# Patient Record
Sex: Female | Born: 1975 | Race: Black or African American | Hispanic: No | Marital: Single | Smoking: Never smoker
Health system: Southern US, Community
[De-identification: ages and names within clinical notes are randomized; demographics above are authoritative.]

## PROBLEM LIST (undated history)

## (undated) DIAGNOSIS — I10 Essential (primary) hypertension: Secondary | ICD-10-CM

## (undated) HISTORY — PX: CHOLECYSTECTOMY: SHX55

## (undated) HISTORY — PX: BREAST SURGERY: SHX581

---

## 2006-09-17 ENCOUNTER — Inpatient Hospital Stay (HOSPITAL_COMMUNITY): Admission: AD | Admit: 2006-09-17 | Discharge: 2006-09-17 | Payer: Self-pay | Admitting: Obstetrics and Gynecology

## 2017-04-12 ENCOUNTER — Observation Stay (HOSPITAL_BASED_OUTPATIENT_CLINIC_OR_DEPARTMENT_OTHER)
Admission: EM | Admit: 2017-04-12 | Discharge: 2017-04-13 | Disposition: A | Discharge status: Home / Self Care | Payer: BLUE CROSS/BLUE SHIELD | Attending: Internal Medicine | Admitting: Internal Medicine

## 2017-04-12 ENCOUNTER — Encounter (HOSPITAL_BASED_OUTPATIENT_CLINIC_OR_DEPARTMENT_OTHER): Payer: Self-pay | Admitting: Emergency Medicine

## 2017-04-12 ENCOUNTER — Observation Stay (HOSPITAL_COMMUNITY): Payer: BLUE CROSS/BLUE SHIELD

## 2017-04-12 ENCOUNTER — Emergency Department (HOSPITAL_BASED_OUTPATIENT_CLINIC_OR_DEPARTMENT_OTHER): Payer: BLUE CROSS/BLUE SHIELD

## 2017-04-12 DIAGNOSIS — R2990 Unspecified symptoms and signs involving the nervous system: Secondary | ICD-10-CM | POA: Diagnosis present

## 2017-04-12 DIAGNOSIS — R200 Anesthesia of skin: Secondary | ICD-10-CM | POA: Diagnosis not present

## 2017-04-12 DIAGNOSIS — I10 Essential (primary) hypertension: Secondary | ICD-10-CM | POA: Insufficient documentation

## 2017-04-12 DIAGNOSIS — R531 Weakness: Secondary | ICD-10-CM | POA: Insufficient documentation

## 2017-04-12 DIAGNOSIS — I639 Cerebral infarction, unspecified: Secondary | ICD-10-CM

## 2017-04-12 HISTORY — DX: Essential (primary) hypertension: I10

## 2017-04-12 LAB — CBC
HEMATOCRIT: 38.3 % (ref 36.0–46.0)
HEMATOCRIT: 36.6 % (ref 36.0–46.0)
Hemoglobin: 13.2 g/dL (ref 12.0–15.0)
Hemoglobin: 12.1 g/dL (ref 12.0–15.0)
MCH: 33.2 pg (ref 26.0–34.0)
MCH: 31.5 pg (ref 26.0–34.0)
MCHC: 34.5 g/dL (ref 30.0–36.0)
MCHC: 33.1 g/dL (ref 30.0–36.0)
MCV: 96.2 fL (ref 78.0–100.0)
MCV: 95.3 fL (ref 78.0–100.0)
PLATELETS: 370 10*3/uL (ref 150–400)
Platelets: 358 10*3/uL (ref 150–400)
RBC: 3.98 MIL/uL (ref 3.87–5.11)
RBC: 3.84 MIL/uL — AB (ref 3.87–5.11)
RDW: 11.9 % (ref 11.5–15.5)
RDW: 12.2 % (ref 11.5–15.5)
WBC: 8.2 10*3/uL (ref 4.0–10.5)
WBC: 7.3 10*3/uL (ref 4.0–10.5)

## 2017-04-12 LAB — URINALYSIS, ROUTINE W REFLEX MICROSCOPIC
BILIRUBIN URINE: NEGATIVE
GLUCOSE, UA: NEGATIVE mg/dL
KETONES UR: NEGATIVE mg/dL
Leukocytes, UA: NEGATIVE
Nitrite: NEGATIVE
PROTEIN: NEGATIVE mg/dL
Specific Gravity, Urine: 1.012 (ref 1.005–1.030)
pH: 5.5 (ref 5.0–8.0)

## 2017-04-12 LAB — COMPREHENSIVE METABOLIC PANEL
ALT: 14 U/L (ref 14–54)
AST: 19 U/L (ref 15–41)
Albumin: 4.2 g/dL (ref 3.5–5.0)
Alkaline Phosphatase: 41 U/L (ref 38–126)
Anion gap: 9 (ref 5–15)
BUN: 11 mg/dL (ref 6–20)
CHLORIDE: 106 mmol/L (ref 101–111)
CO2: 23 mmol/L (ref 22–32)
Calcium: 9.3 mg/dL (ref 8.9–10.3)
Creatinine, Ser: 0.72 mg/dL (ref 0.44–1.00)
Glucose, Bld: 93 mg/dL (ref 65–99)
POTASSIUM: 3 mmol/L — AB (ref 3.5–5.1)
SODIUM: 138 mmol/L (ref 135–145)
Total Bilirubin: 1.2 mg/dL (ref 0.3–1.2)
Total Protein: 8.4 g/dL — ABNORMAL HIGH (ref 6.5–8.1)

## 2017-04-12 LAB — URINALYSIS, MICROSCOPIC (REFLEX): WBC, UA: NONE SEEN WBC/hpf (ref 0–5)

## 2017-04-12 LAB — CREATININE, SERUM
Creatinine, Ser: 0.82 mg/dL (ref 0.44–1.00)
GFR calc non Af Amer: >60 mL/min (ref >60)

## 2017-04-12 LAB — PREGNANCY, URINE: PREG TEST UR: NEGATIVE

## 2017-04-12 LAB — DIFFERENTIAL
BASOS ABS: 0 10*3/uL (ref 0.0–0.1)
BASOS PCT: 0 %
EOS ABS: 0.1 10*3/uL (ref 0.0–0.7)
Eosinophils Relative: 2 %
Lymphocytes Relative: 44 %
Lymphs Abs: 3.6 10*3/uL (ref 0.7–4.0)
MONO ABS: 0.7 10*3/uL (ref 0.1–1.0)
MONOS PCT: 9 %
NEUTROS ABS: 3.7 10*3/uL (ref 1.7–7.7)
Neutrophils Relative %: 45 %

## 2017-04-12 LAB — PROTIME-INR
INR: 1.01
PROTHROMBIN TIME: 13.3 s (ref 11.4–15.2)

## 2017-04-12 LAB — APTT: APTT: 23 s — AB (ref 24–36)

## 2017-04-12 LAB — CBG MONITORING, ED: GLUCOSE-CAPILLARY: 94 mg/dL (ref 65–99)

## 2017-04-12 LAB — TROPONIN I

## 2017-04-12 MED ORDER — ACETAMINOPHEN 650 MG RE SUPP: 650.0000 mg | RECTAL | Status: DC | PRN

## 2017-04-12 MED ORDER — HEPARIN SODIUM (PORCINE) 5000 UNIT/ML IJ SOLN
5000.0000 [IU] | Freq: Three times a day (TID) | INTRAMUSCULAR | Status: DC
  Administered 2017-04-12 – 2017-04-13 (×2): 5000 [IU] via SUBCUTANEOUS
  Filled 2017-04-12: qty 1

## 2017-04-12 MED ORDER — SODIUM CHLORIDE 0.9 % IV SOLN
INTRAVENOUS | Status: AC
  Administered 2017-04-12: 15:00:00 via INTRAVENOUS

## 2017-04-12 MED ORDER — ASPIRIN 81 MG PO CHEW
324.0000 mg | CHEWABLE_TABLET | Freq: Once | ORAL | Status: AC
  Administered 2017-04-12: 324 mg via ORAL
  Filled 2017-04-12: qty 4

## 2017-04-12 MED ORDER — HYDRALAZINE HCL 20 MG/ML IJ SOLN: 10.0000 mg | Freq: Three times a day (TID) | INTRAMUSCULAR | Status: DC | PRN

## 2017-04-12 MED ORDER — ACETAMINOPHEN 160 MG/5ML PO SOLN: 650.0000 mg | ORAL | Status: DC | PRN

## 2017-04-12 MED ORDER — POTASSIUM CHLORIDE CRYS ER 20 MEQ PO TBCR
40.0000 meq | EXTENDED_RELEASE_TABLET | Freq: Once | ORAL | Status: AC
  Administered 2017-04-12: 40 meq via ORAL
  Filled 2017-04-12 (×2): qty 2

## 2017-04-12 MED ORDER — ACETAMINOPHEN 325 MG PO TABS: 650.0000 mg | ORAL_TABLET | ORAL | Status: DC | PRN

## 2017-04-12 MED ORDER — STROKE: EARLY STAGES OF RECOVERY BOOK
Freq: Once | Status: AC
  Administered 2017-04-12: 1

## 2017-04-12 NOTE — ED Notes (Signed)
ED Provider at bedside. 

## 2017-04-12 NOTE — ED Notes (Signed)
At bedside from 1056 - 1120   - patient continues to be A/O x 3 - Awaiting patient transfer

## 2017-04-12 NOTE — ED Notes (Signed)
Patient transported to CT 

## 2017-04-12 NOTE — ED Triage Notes (Signed)
Numbness to L side of face and arm at 8:45 this morning. Noted to have L side weakness to arm and L side arm drift noted.

## 2017-04-12 NOTE — Progress Notes (Signed)
Called by Dr. Lynelle DoctorKnapp about possible stroke in this patient. Pt woke with L sided numbness in the early morning hours this morning but went back to sleep. At about 0845 she noted return of L sided numbness and c/o L weakness as well. On exam she reportedly has mild L sided weakness with drift, no other objective findings. NIHSS 2. Given description of minor deficits would not recommend tPA. Advise admit to [facility] for routine stroke evaluation. Will see in consultation as needed.

## 2017-04-12 NOTE — ED Notes (Signed)
Pt transported to CT; RN at bedside.  

## 2017-04-12 NOTE — Plan of Care (Signed)
CC: L numbness & weakness Dx: stroke like sx HPI: Pt awoke with LUE numbness, went back to sleep thinking she slept on it. Felt it resolved but then symptoms recurred with left upper and lower extremity numbness and facial numbness and left-sided weakness as well. Patient then went to the emergency room for evaluation. ED course: Case discussed with Dr. Roxy Mannsster (neuro) advised no TPA. NIH score 2. CT head neg.  Plan: stroke w/u  Ordered: asa, UA, u preg Status: obs, tele  Bianca SalterPhillip M Hobbs MD

## 2017-04-12 NOTE — Evaluation (Signed)
Physical Therapy Evaluation Patient Details Name: Bianca Moutoniffany Villa MRN: [id number] DOB: 05-15-1976 Today's Date: 04/12/2017   History of Present Illness  Patient is a 41 yo female admitted 04/12/17 with numbness of Lt face and LUE.  NIH = 2.   MRI negative.     PMH:  HTN  Clinical Impression  Patient with problems listed below.  Patient's LLE tested slightly weaker than RLE - ? Effort.  With gait, patient with Lt knee buckling impacting gait.  Recommend OP PT to address LLE and gait.    Follow Up Recommendations Outpatient PT;Supervision - Intermittent    Equipment Recommendations  None recommended by PT    Recommendations for Other Services       Precautions / Restrictions Precautions Precautions: None Restrictions Weight Bearing Restrictions: No      Mobility  Bed Mobility Overal bed mobility: Independent                Transfers Overall transfer level: Modified independent Equipment used: None             General transfer comment: Increased time  Ambulation/Gait Ambulation/Gait assistance: Min guard Ambulation Distance (Feet): 140 Feet Assistive device: None Gait Pattern/deviations: Step-through pattern;Decreased stance time - left;Decreased step length - right;Decreased stride length Gait velocity: decreased Gait velocity interpretation: Below normal speed for age/gender General Gait Details: Patient with Lt knee slightly buckling during gait.  Patient able to catch herself with no loss of balance.   During distraction of conversation, no buckling noted.  Stairs Stairs: Yes Stairs assistance: Min guard Stair Management: One rail Right;Step to pattern;Forwards Number of Stairs: 3 General stair comments: Instructed patient on step-to negotiation of stairs.  Patient able to perform with rail on Rt.  Wheelchair Mobility    Modified Rankin (Stroke Patients Only) Modified Rankin (Stroke Patients Only) Pre-Morbid Rankin Score: No symptoms Modified  Rankin: Slight disability     Balance Overall balance assessment: No apparent balance deficits (not formally assessed)                                           Pertinent Vitals/Pain Pain Assessment: No/denies pain    Home Living Family/patient expects to be discharged to:: Private residence Living Arrangements: Spouse/significant other;Children Available Help at Discharge: Family;Available PRN/intermittently Type of Home: Apartment Home Access: Stairs to enter Entrance Stairs-Rails: Right Entrance Stairs-Number of Steps: 3 flights Home Layout: One level Home Equipment: None      Prior Function Level of Independence: Independent               Hand Dominance   Dominant Hand: Right    Extremity/Trunk Assessment   Upper Extremity Assessment Upper Extremity Assessment: Defer to OT evaluation    Lower Extremity Assessment Lower Extremity Assessment: LLE deficits/detail LLE Deficits / Details: LLE MMT tested slightly weaker than RLE - grossly 4/5.   Question effort.    Cervical / Trunk Assessment Cervical / Trunk Assessment: Normal  Communication   Communication: No difficulties  Cognition Arousal/Alertness: Awake/alert Behavior During Therapy: Flat affect Overall Cognitive Status: Within Functional Limits for tasks assessed                                        General Comments      Exercises General Exercises - Lower  Extremity Mini-Sqauts: AROM;Both;15 reps;Standing (Feet even, Rt foot forward, Lt foot forward)   Assessment/Plan    PT Assessment Patient needs continued PT services  PT Problem List Impaired sensation;Decreased mobility       PT Treatment Interventions Gait training;Stair training;Functional mobility training;Therapeutic activities;Therapeutic exercise;Patient/family education    PT Goals (Current goals can be found in the Care Plan section)  Acute Rehab PT Goals Patient Stated Goal: None  stated PT Goal Formulation: With patient Time For Goal Achievement: 04/19/17 Potential to Achieve Goals: Good    Frequency Min 3X/week   Barriers to discharge        Co-evaluation               AM-PAC PT "6 Clicks" Daily Activity  Outcome Measure Difficulty turning over in bed (including adjusting bedclothes, sheets and blankets)?: None Difficulty moving from lying on back to sitting on the side of the bed? : None Difficulty sitting down on and standing up from a chair with arms (e.g., wheelchair, bedside commode, etc,.)?: None Help needed moving to and from a bed to chair (including a wheelchair)?: A Little Help needed walking in hospital room?: A Little Help needed climbing 3-5 steps with a railing? : A Little 6 Click Score: 21    End of Session Equipment Utilized During Treatment: Gait belt Activity Tolerance: Patient tolerated treatment well Patient left: in bed;with call bell/phone within reach;with family/visitor present Nurse Communication: Mobility status PT Visit Diagnosis: Other abnormalities of gait and mobility (R26.89)    Time: 8119-1478 PT Time Calculation (min) (ACUTE ONLY): 27 min   Charges:   PT Evaluation $PT Eval Moderate Complexity: 1 Procedure PT Treatments $Gait Training: 8-22 mins   PT G Codes:   PT G-Codes **NOT FOR INPATIENT CLASS** Functional Assessment Tool Used: AM-PAC 6 Clicks Basic Mobility Functional Limitation: Mobility: Walking and moving around Mobility: Walking and Moving Around Current Status (G9562): At least 20 percent but less than 40 percent impaired, limited or restricted Mobility: Walking and Moving Around Goal Status [phone]): At least 1 percent but less than 20 percent impaired, limited or restricted    Durenda Hurt. Renaldo Fiddler, [facility] Acute Rehab Services Pager [phone]   Vena Austria 04/12/2017, 11:39 PM

## 2017-04-12 NOTE — Consult Note (Signed)
Neurology Consult Note  Reason for Consultation: L-sided numbness  Requesting provider: Eliezer BottomPhillip Hobbs, MD  CC: left-sided numbness  HPI: This is a 40-yo RH woman who is transferred from [facility] for admission for possible stroke. History is obtained directly from the patient who is an excellent historian.   She reports that she was in her usual state of health when she went to be last night. At about 2 am, she woke up to check on the house when she noted some mild numbness of her left face and arm. She figured she may have slept on her left side, causing her numbness, and went back to sleep. When she got up this morning her numbness was gone. However, at about 0830, she was driving her car when the numbness returned. This involves her left face and arm with lesser involvement of her left leg. She denies any weakness, vision loss, double vision, speech changes, difficulty swallowing, problems with coordination, or changes in her gait. She states that she always has a headache but did not notice any unusual headache today. She has never had any similar symptoms in the past.   She went to [facility] where she was noted to have NIHSS score of 2 with subjective numbness and drift of the left arm and left leg. CTH was obtained and was negative. I was called by the ED MD and based upon her mild deficits I decided not to administer tPA. She has been transferred to [facility] for [facility] and further evaluation.   She denies any recent illnesses. 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   PMH:  Past Medical History:  Diagnosis Date  . Hypertension     PSH:  Past Surgical History:  Procedure Laterality Date  . BREAST SURGERY    . CHOLECYSTECTOMY      Family history: Sister with DM. Grandmother had a stroke late in life.   Social history:  Social History   Social History  . Marital status: Single    Spouse name: N/A   . Number of children: N/A  . Years of education: N/A   Occupational History  . Not on file.   Social History Main Topics  . Smoking status: Never Smoker  . Smokeless tobacco: Never Used  . Alcohol use Not on file  . Drug use: Unknown  . Sexual activity: Not on file   Other Topics Concern  . Not on file   Social History Narrative  . No narrative on file    Current outpatient meds: Medications reviewed and reconciled.  Current Meds  Medication Sig  . amLODipine (NORVASC) 10 MG tablet Take 10 mg by mouth daily.    Current inpatient meds: Medications reviewed and reconciled.  Current Facility-Administered Medications  Medication Dose Route Frequency Provider Last Rate Last Dose  . potassium chloride SA (K-DUR,KLOR-CON) CR tablet 40 mEq  40 mEq Oral Once Linwood DibblesKnapp, Jon, MD        Allergies: Not on File  ROS: As per HPI. A full 14-point review of systems was performed and is otherwise unremarkable.   PE:  BP 126/82   Pulse 87   Temp 98.5 F (36.9 C)   Resp 18   Ht 5\' 2"  (1.575 m)   Wt 78.5 kg (173 lb)   SpO2 100%   BMI 31.64 kg/m   General: WDWN, no acute distress. AAO x4. Speech clear, no dysarthria. No  aphasia. Follows commands briskly. Affect is mildly restricted. Comportment is normal.  HEENT: Normocephalic. Neck supple without LAD. MMM, OP clear. Dentition good. Sclerae anicteric. No conjunctival injection.  CV: Regular, no murmur. Carotid pulses full and symmetric, no bruits. Distal pulses 2+ and symmetric.  Lungs: CTAB.  Abdomen: Soft, non-distended, non-tender. Bowel sounds present x4.  Extremities: No C/C/E. Neuro:  CN: Pupils are equal and round. They are symmetrically reactive from 3-->2 mm. EOMI without nystagmus. No reported diplopia. Facial sensation is decreased to light touch and pinprick. Vibration is diminished on the L forehead, intact on the R. She appear to split the midline. Face is symmetric at rest with normal strength and mobility. Hearing is  intact to conversational voice. Palate elevates symmetrically and uvula is midline. Voice is normal in tone, pitch and quality. Bilateral SCM and trapezii are 5/5. Tongue is midline with normal bulk and mobility.  Motor: Normal bulk, tone, and strength. She has some inconsistent weakness on the left side when that side is tested independently vs when both sides are tested simultaneously. No tremor or other abnormal movements. No drift.  Sensation: Decreased to light touch and pinprick on the L side.  DTRs: 2+, symmetric. Toes downgoing bilaterally. No pathologic reflexes.  Coordination: Finger-to-nose and heel-to-shin are without dysmetria. Finger taps are normal in amplitude and speed, no decrement.    Labs:  Lab Results  Component Value Date   WBC 8.2 04/12/2017   HGB 13.2 04/12/2017   HCT 38.3 04/12/2017   PLT 370 04/12/2017   GLUCOSE 93 04/12/2017   ALT 14 04/12/2017   AST 19 04/12/2017   NA 138 04/12/2017   K 3.0 (L) 04/12/2017   CL 106 04/12/2017   CREATININE 0.72 04/12/2017   BUN 11 04/12/2017   CO2 23 04/12/2017   INR 1.01 04/12/2017   Troponin <0.03 PTT 23 Upreg negative UA notable for large Hgb  Imaging:  I have personally and independently reviewed the [facility] without contrast from today. This is unremarkable.  Assessment and Plan:  1. Left-sided numbness: This is acute. he reports decreased sensation over the left face, arm and leg with midline splitting. Vibration is reduced on the left side of the forehead but intact on the right. Findings are atypical and raise concern for non-physiologic sensory loss. Recommend MRI brain to further investigate. If this is normal, there is no need to complete the remainder of the stroke workup. PT/OT to evaluate and treat.   2. Left-sided weakness: This is acute. This is inconsistent and suggestive of non-physiologic weakness. MRI brain, PT, OT as above.   This was discussed with the patient. Education was provided on the diagnosis  and expected evaluation and treatment. She is in agreement with the plan as noted. She was given the opportunity to ask any questions and these were addressed to her satisfaction.

## 2017-04-12 NOTE — ED Notes (Signed)
Per MD the patient is stable for transfer. MD knapp at the bedside with another critical patient

## 2017-04-12 NOTE — H&P (Signed)
[facility] History and Physical  Bianca Moutoniffany Wohler YNW:[id number] DOB: 1976-04-21 DOA: 04/12/2017  Referring physician:  PCP: Shellia CleverlyBeane, Lori M, PA   Chief Complaint: Left extremity weakness  HPI: Bianca Villa is a 41 y.o. female past history significant for hypertension. Pt awoke with LUE numbness, went back to sleep thinking she slept on it. Felt it resolved but then symptoms recurred with left upper and lower extremity numbness and facial numbness and left-sided weakness as well. Patient then went to the emergency room for evaluation.  No family history of stroke. Denies tobacco history.  ED course: Case discussed with Dr. Roxy Mannsster (neuro) advised no TPA. NIH score 2. CT head neg.   Review of Systems:  As per HPI otherwise 10 point review of systems negative.    Past Medical History:  Diagnosis Date  . Hypertension    Past Surgical History:  Procedure Laterality Date  . BREAST SURGERY    . CHOLECYSTECTOMY     Social History:  reports that she has never smoked. She has never used smokeless tobacco. Her alcohol and drug histories are not on file.  Allergies  Allergen Reactions  . Atenolol Other (See Comments)    unknown  . Potassium-Containing Compounds Rash  . Sulfa Antibiotics Rash    No family history on file.   Prior to Admission medications   Medication Sig Start Date End Date Taking? Authorizing Provider  amLODipine (NORVASC) 10 MG tablet Take 10 mg by mouth daily.   Yes [provider]   Physical Exam: Vitals:   04/12/17 1303 04/12/17 1330 04/12/17 1500 04/12/17 1800  BP:  126/82 133/78 120/76  Pulse:  87 74 64  Resp:  18 20 18   Temp: 98.5 F (36.9 C)  98.6 F (37 C) 98.6 F (37 C)  TempSrc:   Oral   SpO2:  100% 100% 99%  Weight:      Height:        Wt Readings from Last 3 Encounters:  04/12/17 78.5 kg (173 lb)    General:  Appears calm and comfortable; A&Ox3 Eyes:  PERRL, EOMI, normal lids, iris ENT:  grossly normal hearing,  lips & tongue Neck:  no LAD, masses or thyromegaly Cardiovascular:  RRR, no m/r/g. No LE edema.  Respiratory:  CTA bilaterally, no w/r/r. Normal respiratory effort. Abdomen:  soft, ntnd Skin: no rash or induration seen on limited exam Musculoskeletal:  grossly normal tone BUE/BLE, left side slightly weaker than right Psychiatric:  grossly normal mood and affect, speech fluent and appropriate Neurologic:  CN 2-12 grossly intact, moves all extremities in coordinated fashion.          Labs on Admission:  Basic Metabolic Panel:  Recent Labs Lab 04/12/17 1110 04/12/17 1503  NA 138  --   K 3.0*  --   CL 106  --   CO2 23  --   GLUCOSE 93  --   BUN 11  --   CREATININE 0.72 0.82  CALCIUM 9.3  --    Liver Function Tests:  Recent Labs Lab 04/12/17 1110  AST 19  ALT 14  ALKPHOS 41  BILITOT 1.2  PROT 8.4*  ALBUMIN 4.2   No results for input(s): LIPASE, AMYLASE in the last 168 hours. No results for input(s): AMMONIA in the last 168 hours. CBC:  Recent Labs Lab 04/12/17 1110 04/12/17 1503  WBC 8.2 7.3  NEUTROABS 3.7  --   HGB 13.2 12.1  HCT 38.3 36.6  MCV 96.2 95.3  PLT 370  358   Cardiac Enzymes:  Recent Labs Lab 04/12/17 1107  TROPONINI <0.03    BNP (last 3 results) No results for input(s): BNP in the last 8760 hours.  ProBNP (last 3 results) No results for input(s): PROBNP in the last 8760 hours.   Serum creatinine: 0.82 mg/dL 16/10/96 0454 Estimated creatinine clearance: 87.7 mL/min  CBG:  Recent Labs Lab 04/12/17 1113  GLUCAP 94    Radiological Exams on Admission: Ct Head Code Stroke W/o Cm  Result Date: 04/12/2017 CLINICAL DATA:  Code stroke. 41 year old female with left side facial numbness, left extremity numbness and weakness. Headache for 1 week. EXAM: CT HEAD WITHOUT CONTRAST TECHNIQUE: Contiguous axial images were obtained from the base of the skull through the vertex without intravenous contrast. COMPARISON:  None. FINDINGS: Brain:  Normal cerebral volume. No midline shift, ventriculomegaly, mass effect, evidence of mass lesion, intracranial hemorrhage or evidence of cortically based acute infarction. Gray-white matter differentiation is within normal limits throughout the brain. Vascular: No suspicious intracranial vascular hyperdensity. Skull: No osseous abnormality identified. Sinuses/Orbits: Clear. Other: Visualized orbits and scalp soft tissues are within normal limits. ASPECTS [facility] Stroke Program Early CT Score) - Ganglionic level infarction (caudate, lentiform nuclei, internal capsule, insula, M1-M3 cortex): 7 - Supraganglionic infarction (M4-M6 cortex): 3 Total score (0-10 with 10 being normal): 10 IMPRESSION: 1. Normal noncontrast CT appearance of the brain. 2. ASPECTS is 10. 3. Study discussed by telephone with Dr. Linwood Dibbles on 04/12/2017 at 11:05 . Electronically Signed   By: Odessa Fleming M.D.   On: 04/12/2017 11:06    EKG: Independently reviewed. No STEMI.  Assessment/Plan Active Problems:   Stroke-like episode (HCC)  Stroke CT head negative for acute bleed MRI/MRA ordered Aspirin full dose given  And daily A1c ordered Lipid panel ordered Admitted to telemetry bed Echo ordered Carotid Dopplers ordered MRA ordered Stroke team to follow patient  Hypertension When necessary hydralazine 10 mg IV as needed for severe blood pressure Hold norvasc - permissive htn  Code Status: full DVT Prophylaxis: heparin Family Communication: husband at bedside Disposition Plan: Pending Improvement  Status: tele, obs  Haydee Salter, MD Family Medicine [facility] [email or web address] Password [email or web address]

## 2017-04-12 NOTE — ED Provider Notes (Signed)
[facility] Provider Note   CSN: [id number] Arrival date & time: 04/12/17  1037     History   Chief Complaint Chief Complaint  Patient presents with  . Numbness    HPI Bianca Villa is a 41 y.o. female.  HPI Patient presents to the emergency room for evaluation of acute numbness of the left side of her face and arm. Patient went to bed last evening maybe around 9:00. She felt fine at that time. She woke up at about 2 AM and noticed an altered sensation in her left face as if it was partially asleep. The symptoms were mild and she went back to bed. When she woke up later this morning she states she was feeling fine. She was getting ready to leave 1 at 845 she suddenly felt the symptoms return. Patient felt left face and arm were tingling. She also feels that her left arm and leg feel weaker than normal. Patient denies any recent head injuries. No history of stroke. No history of cerebral hemorrhage. She does have history of hypertension. Past Medical History:  Diagnosis Date  . Hypertension     There are no active problems to display for this patient.   Past Surgical History:  Procedure Laterality Date  . BREAST SURGERY    . CHOLECYSTECTOMY      OB History    No data available       Home Medications    Prior to Admission medications   Medication Sig Start Date End Date Taking? Authorizing Provider  amLODipine (NORVASC) 10 MG tablet Take 10 mg by mouth daily.   Yes [provider]    Family History No family history on file.  Social History Social History  Substance Use Topics  . Smoking status: Never Smoker  . Smokeless tobacco: Never Used  . Alcohol use Not on file     Allergies   Patient has no allergy information on record.   Review of Systems Review of Systems  All other systems reviewed and are negative.    Physical Exam Updated Vital Signs BP (!) 165/95 (BP Location: Left Arm)   Pulse (!) 115   Temp 99.8 F (37.7  C) (Oral)   Resp 20   Ht 1.575 m (5\' 2" )   Wt 78.5 kg (173 lb)   SpO2 100%   BMI 31.64 kg/m   Physical Exam  Constitutional: She is oriented to person, place, and time. She appears well-developed and well-nourished. No distress.  HENT:  Head: Normocephalic and atraumatic.  Right Ear: External ear normal.  Left Ear: External ear normal.  Mouth/Throat: Oropharynx is clear and moist.  Eyes: Conjunctivae are normal. Right eye exhibits no discharge. Left eye exhibits no discharge. No scleral icterus.  Neck: Neck supple. No tracheal deviation present.  Cardiovascular: Normal rate, regular rhythm and intact distal pulses.   Pulmonary/Chest: Effort normal and breath sounds normal. No stridor. No respiratory distress. She has no wheezes. She has no rales.  Abdominal: Soft. Bowel sounds are normal. She exhibits no distension. There is no tenderness. There is no rebound and no guarding.  Musculoskeletal: She exhibits no edema or tenderness.  Neurological: She is alert and oriented to person, place, and time. No cranial nerve deficit (no facial droop, extraocular movements intact, no slurred speech) or sensory deficit. She exhibits normal muscle tone. She displays no seizure activity. Coordination normal.  pronator drift left  bilateral upper extrem, unable to hold left leg off bed for 5 seconds, sensation  intact in all extremities, no visual field cuts, no left or right sided neglect, normal finger-nose exam bilaterally, no nystagmus noted   Nl speech and reading.  NIHSS of 2 (dift in lue and lle)  Skin: Skin is warm and dry. No rash noted.  Psychiatric: She has a normal mood and affect.  Nursing note and vitals reviewed.    ED Treatments / Results  Labs (all labs ordered are listed, but only abnormal results are displayed) Labs Reviewed  APTT - Abnormal; Notable for the following:       Result Value   aPTT 23 (*)    All other components within normal limits  COMPREHENSIVE METABOLIC  PANEL - Abnormal; Notable for the following:    Potassium 3.0 (*)    Total Protein 8.4 (*)    All other components within normal limits  PROTIME-INR  CBC  DIFFERENTIAL  TROPONIN I  CBG MONITORING, ED    EKG         EKGNormal sinus rhythm rate 83Normal axis, normal intervals, normal ST and T waves  [JK]    Clinical Course User Index [JK] Linwood DibblesKnapp, Lillith Mcneff, MD   Radiology Ct Head Code Stroke W/o Cm  Result Date: 04/12/2017 CLINICAL DATA:  Code stroke. 41 year old female with left side facial numbness, left extremity numbness and weakness. Headache for 1 week. EXAM: CT HEAD WITHOUT CONTRAST TECHNIQUE: Contiguous axial images were obtained from the base of the skull through the vertex without intravenous contrast. COMPARISON:  None. FINDINGS: Brain: Normal cerebral volume. No midline shift, ventriculomegaly, mass effect, evidence of mass lesion, intracranial hemorrhage or evidence of cortically based acute infarction. Gray-white matter differentiation is within normal limits throughout the brain. Vascular: No suspicious intracranial vascular hyperdensity. Skull: No osseous abnormality identified. Sinuses/Orbits: Clear. Other: Visualized orbits and scalp soft tissues are within normal limits. ASPECTS [facility]([place] Stroke Program Early CT Score) - Ganglionic level infarction (caudate, lentiform nuclei, internal capsule, insula, M1-M3 cortex): 7 - Supraganglionic infarction (M4-M6 cortex): 3 Total score (0-10 with 10 being normal): 10 IMPRESSION: 1. Normal noncontrast CT appearance of the brain. 2. ASPECTS is 10. 3. Study discussed by telephone with Dr. Linwood DibblesJON Estell Dillinger on 04/12/2017 at 11:05 . Electronically Signed   By: Odessa FlemingH  Hall M.D.   On: 04/12/2017 11:06    Procedures Procedures (including critical care time)  Medications Ordered in ED Medications  potassium chloride SA (K-DUR,KLOR-CON) CR tablet 40 mEq (not administered)     Initial Impression / Assessment and Plan / ED Course  I have reviewed the triage  vital signs and the nursing notes.  Pertinent labs & imaging results that were available during my care of the patient were reviewed by me and considered in my medical decision making (see chart for details).  Clinical Course as of Apr 12 1210  Sat Apr 12, 2017  1125 D/w Dr Roxy Mannsster.  Reviewed NIH stroke score, history and exam.  NIH stroke score of 2.  Does not feel that based on her sx she is a TPA candidate.  Will proceed with admission and further workup  [JK]  1211 D/w Dr Melynda RippleHobbs.  [facility]  [JK]    Clinical Course User Index [JK] Linwood DibblesKnapp, Cherrie Franca, MD    Patient presents to the emergency room with acute numbness and weakness on the left side. Her symptoms are concerning for the possibility of stroke.  Other considerations do include complex migraine, MS or other etiologies.  I spoke with Dr. Konrad DoloresLester, neurology. We will hold off on  TPA treatment at this time considering the mild nature of her symptoms. Plan on transfer and admission to [facility] for further treatment.  I had a Long discussion with the patient as well as her husband regarding our findings and plan . Final Clinical Impressions(s) / ED Diagnoses   Final diagnoses:  Weakness      Linwood Dibbles, MD 04/12/17 1211

## 2017-04-12 NOTE — ED Notes (Signed)
MD at the bedside doing NIH -

## 2017-04-13 ENCOUNTER — Observation Stay (HOSPITAL_BASED_OUTPATIENT_CLINIC_OR_DEPARTMENT_OTHER): Payer: BLUE CROSS/BLUE SHIELD

## 2017-04-13 DIAGNOSIS — I639 Cerebral infarction, unspecified: Secondary | ICD-10-CM

## 2017-04-13 LAB — VAS US CAROTID
LCCAPSYS: 96 cm/s
LEFT ECA DIAS: -17 cm/s
LEFT VERTEBRAL DIAS: -26 cm/s
LICADDIAS: -40 cm/s
Left CCA dist dias: -36 cm/s
Left CCA dist sys: -95 cm/s
Left CCA prox dias: 30 cm/s
Left ICA dist sys: -86 cm/s
Left ICA prox dias: -15 cm/s
Left ICA prox sys: -59 cm/s
RCCAPDIAS: 24 cm/s
RCCAPSYS: 98 cm/s
RIGHT ECA DIAS: -21 cm/s
RIGHT VERTEBRAL DIAS: -28 cm/s
Right cca dist sys: -80 cm/s

## 2017-04-13 LAB — BASIC METABOLIC PANEL
Anion gap: 7 (ref 5–15)
BUN: 9 mg/dL (ref 6–20)
CALCIUM: 8.6 mg/dL — AB (ref 8.9–10.3)
CO2: 25 mmol/L (ref 22–32)
CREATININE: 0.81 mg/dL (ref 0.44–1.00)
Chloride: 107 mmol/L (ref 101–111)
GFR calc Af Amer: >60 mL/min (ref >60)
Glucose, Bld: 91 mg/dL (ref 65–99)
Potassium: 3.6 mmol/L (ref 3.5–5.1)
SODIUM: 139 mmol/L (ref 135–145)

## 2017-04-13 LAB — LIPID PANEL
CHOL/HDL RATIO: 4.6 ratio
Cholesterol: 175 mg/dL (ref 0–200)
HDL: 38 mg/dL — AB (ref >40)
LDL CALC: 117 mg/dL — AB (ref 0–99)
Triglycerides: 99 mg/dL (ref <150)
VLDL: 20 mg/dL (ref 0–40)

## 2017-04-13 LAB — HIV ANTIBODY (ROUTINE TESTING W REFLEX): HIV SCREEN 4TH GENERATION: NONREACTIVE

## 2017-04-13 MED ORDER — AMLODIPINE BESYLATE 5 MG PO TABS: 5.0000 mg | ORAL_TABLET | Freq: Every day | ORAL | 0 refills | Status: DC

## 2017-04-13 NOTE — Discharge Instructions (Signed)
Stress and Stress Management °Stress is a normal reaction to life events. It is what you feel when life demands more than you are used to or more than you can handle. Some stress can be useful. For example, the stress reaction can help you catch the last bus of the day, study for a test, or meet a deadline at work. But stress that occurs too often or for too long can cause problems. It can affect your emotional health and interfere with relationships and normal daily activities. Too much stress can weaken your immune system and increase your risk for physical illness. If you already have a medical problem, stress can make it worse. °What are the causes? °All sorts of life events may cause stress. An event that causes stress for one person may not be stressful for another person. Major life events commonly cause stress. These may be positive or negative. Examples include losing your job, moving into a new home, getting married, having a baby, or losing a loved one. Less obvious life events may also cause stress, especially if they occur day after day or in combination. Examples include working long hours, driving in traffic, caring for children, being in debt, or being in a difficult relationship. °What are the signs or symptoms? °Stress may cause emotional symptoms including, the following: °· Anxiety. This is feeling worried, afraid, on edge, overwhelmed, or out of control. °· Anger. This is feeling irritated or impatient. °· Depression. This is feeling sad, down, helpless, or guilty. °· Difficulty focusing, remembering, or making decisions. ° °Stress may cause physical symptoms, including the following: °· Aches and pains. These may affect your head, neck, back, stomach, or other areas of your body. °· Tight muscles or clenched jaw. °· Low energy or trouble sleeping. ° °Stress may cause unhealthy behaviors, including the following: °· Eating to feel better (overeating) or skipping meals. °· Sleeping too little,  too much, or both. °· Working too much or putting off tasks (procrastination). °· Smoking, drinking alcohol, or using drugs to feel better. ° °How is this diagnosed? °Stress is diagnosed through an assessment by your health care provider. Your health care provider will ask questions about your symptoms and any stressful life events. Your health care provider will also ask about your medical history and may order blood tests or other tests. Certain medical conditions and medicine can cause physical symptoms similar to stress. Mental illness can cause emotional symptoms and unhealthy behaviors similar to stress. Your health care provider may refer you to a mental health professional for further evaluation. °How is this treated? °Stress management is the recommended treatment for stress. The goals of stress management are reducing stressful life events and coping with stress in healthy ways. °Techniques for reducing stressful life events include the following: °· Stress identification. Self-monitor for stress and identify what causes stress for you. These skills may help you to avoid some stressful events. °· Time management. Set your priorities, keep a calendar of events, and learn to say “no.” These tools can help you avoid making too many commitments. ° °Techniques for coping with stress include the following: °· Rethinking the problem. Try to think realistically about stressful events rather than ignoring them or overreacting. Try to find the positives in a stressful situation rather than focusing on the negatives. °· Exercise. Physical exercise can release both physical and emotional tension. The key is to find a form of exercise you enjoy and do it regularly. °· Relaxation techniques. These relax the body and   mind. Examples include yoga, meditation, tai chi, biofeedback, deep breathing, progressive muscle relaxation, listening to music, being out in nature, journaling, and other hobbies. Again, the key is to find  one or more that you enjoy and can do regularly. °· Healthy lifestyle. Eat a balanced diet, get plenty of sleep, and do not smoke. Avoid using alcohol or drugs to relax. °· Strong support network. Spend time with family, friends, or other people you enjoy being around. Express your feelings and talk things over with someone you trust. ° °Counseling or talktherapy with a mental health professional may be helpful if you are having difficulty managing stress on your own. Medicine is typically not recommended for the treatment of stress. Talk to your health care provider if you think you need medicine for symptoms of stress. °Follow these instructions at home: °· Keep all follow-up visits as directed by your health care provider. °· Take all medicines as directed by your health care provider. °Contact a health care provider if: °· Your symptoms get worse or you start having new symptoms. °· You feel overwhelmed by your problems and can no longer manage them on your own. °Get help right away if: °· You feel like hurting yourself or someone else. °This information is not intended to replace advice given to you by your health care provider. Make sure you discuss any questions you have with your health care provider. °Document Released: 04/16/2001 Document Revised: 03/28/2016 Document Reviewed: 06/15/2013 °Elsevier Interactive Patient Education © 2017 [employer] Inc. ° °

## 2017-04-13 NOTE — Progress Notes (Addendum)
RN discussed discharge instructions with patient including pcp f/u, changes to amlodipine, stress mgmt techniques. Neuro assessment unchanged, patient verbalized understanding of d/c instructions. Medical [place]. Will continue to monitor. RN asked patient about setting up op therapy, she stated she did not want it and did not need it.

## 2017-04-13 NOTE — Progress Notes (Signed)
Neurology Progress Note  Subjective: She feels much better this morning and reports full resolution of her left-sided symptoms. She states that they resolved at about 2300 last night. She has no complaints on 10-point ROS.   Medications reviewed and reconciled.   Current Meds:   Current Facility-Administered Medications:  .  0.9 %  sodium chloride infusion, , Intravenous, Continuous, Haydee SalterHobbs, Phillip M, MD, Last Rate: 50 mL/hr at 04/12/17 1500 .  acetaminophen (TYLENOL) tablet 650 mg, 650 mg, Oral, Q4H PRN **OR** acetaminophen (TYLENOL) solution 650 mg, 650 mg, Per Tube, Q4H PRN **OR** acetaminophen (TYLENOL) suppository 650 mg, 650 mg, Rectal, Q4H PRN, Haydee SalterHobbs, Phillip M, MD .  heparin injection 5,000 Units, 5,000 Units, Subcutaneous, Q8H, Haydee SalterHobbs, Phillip M, MD, 5,000 Units at 04/13/17 0615 .  hydrALAZINE (APRESOLINE) injection 10 mg, 10 mg, Intravenous, Q8H PRN, Haydee SalterHobbs, Phillip M, MD  Objective:  Temp:  [98.1 F (36.7 C)-99.8 F (37.7 C)] 98.2 F (36.8 C) (06/10 0600) Pulse Rate:  [62-115] 62 (06/10 0000) Resp:  [16-23] 18 (06/10 0600) BP: (115-165)/(70-95) 128/82 (06/10 0600) SpO2:  [99 %-100 %] 99 % (06/10 0600) Weight:  [78.5 kg (173 lb)] 78.5 kg (173 lb) (06/09 1049)  General: WDWN AA woman resting comfortably in bed playing a game on her tablet. Alert, oriented x4. Speech is clear without dysarthria. Affect is bright. Comportment is normal.  HEENT: Neck is supple without lymphadenopathy. Mucous membranes are moist and the oropharynx is clear. Sclerae are anicteric. There is no conjunctival injection.  CV: Regular, no murmur. Carotid pulses are 2+ and symmetric with no bruits. Distal pulses 2+ and symmetric.  Lungs: CTAB  Extremities: No C/C/E. Neuro: MS: As noted above.  CN: Pupils are equal and reactive from 3-->2 mm bilaterally. EOMI, no nystagmus. Facial sensation is intact to light touch. Face is symmetric at rest with normal strength and mobility. Hearing is intact to  conversational voice. Voice is normal in tone and quality. Palate elevates symmetrically. Uvula is midline. Bilateral SCM and trapezii are 5/5. Tongue is midline with normal bulk and mobility.  Motor: Normal bulk, tone, and strength throughout. No pronator drift. No tremor or other abnormal movements are observed.  Sensation: Intact to light touch.  DTRs: 2+, symmetric. Toes are downgoing bilaterally. No pathological reflexes.  Coordination: Finger-to-nose and heel-to-shin are without dysmetria bilaterally.    Labs: Lab Results  Component Value Date   WBC 7.3 04/12/2017   HGB 12.1 04/12/2017   HCT 36.6 04/12/2017   PLT 358 04/12/2017   GLUCOSE 91 04/13/2017   CHOL 175 04/13/2017   TRIG 99 04/13/2017   HDL 38 (L) 04/13/2017   LDLCALC 117 (H) 04/13/2017   ALT 14 04/12/2017   AST 19 04/12/2017   NA 139 04/13/2017   K 3.6 04/13/2017   CL 107 04/13/2017   CREATININE 0.81 04/13/2017   BUN 9 04/13/2017   CO2 25 04/13/2017   INR 1.01 04/12/2017   CBC Latest Ref Rng & Units 04/12/2017 04/12/2017  WBC 4.0 - 10.5 K/uL 7.3 8.2  Hemoglobin 12.0 - 15.0 g/dL 95.212.1 84.113.2  Hematocrit 32.436.0 - 46.0 % 36.6 38.3  Platelets 150 - 400 K/uL 358 370    No results found for: HGBA1C Lab Results  Component Value Date   ALT 14 04/12/2017   AST 19 04/12/2017   ALKPHOS 41 04/12/2017   BILITOT 1.2 04/12/2017    Radiology:  I have personally and independently reviewed the MRI of the brain without contrast from yesterday. This is normal.  I have personally and independently reviewed the MRA of the head without contrast from yesterday. This is normal.   A/P:   1. Left-sided numbness: This has resolved. Suspect somatization. MRI negative for acute stroke. No stroke risk factors. Discussed conversion reaction, patient denies any major stressors. No further testing necessary. Recommend stress recognition/management.   2. Left-sided weakness: Also resolved. Again, most likely somatization. No further testing  necessary. Recommend stress recognition/management.  This was discussed with the patient. Education was provided on the diagnosis and expected evaluation and treatment. She is in agreement with the plan as noted. She was given the opportunity to ask any questions and these were addressed to her satisfaction.   I have no further recommendations and will sign off. Please call if any new issues arise.   Rhona Leavens, MD [facility]

## 2017-04-13 NOTE — Care Management Note (Signed)
Case Management Note  Patient Details  Name: Bianca Villa MRN: [id number] Date of Birth: 1975-12-11  Subjective/Objective:                 Spoke with patient. She declines OP PT referral at this time. No other CM needs identified. Patient states she has [employer], will place HAR note.   Action/Plan:  DC to home self care.  Expected Discharge Date:  04/13/17               Expected Discharge Plan:  Home/Self Care  In-House Referral:     Discharge planning Services  CM Consult  Post Acute Care Choice:    Choice offered to:  Patient  DME Arranged:    DME Agency:     HH Arranged:    HH Agency:     Status of Service:  Completed, signed off  If discussed at [employer] Length of Stay Meetings, dates discussed:    Additional Comments:  Lawerance SabalDebbie Mahaila Tischer, RN 04/13/2017, 10:17 AM

## 2017-04-13 NOTE — Evaluation (Addendum)
Occupational Therapy Evaluation/Discharge Patient Details Name: Bianca Villa MRN: [id number] DOB: May 13, 1976 Today's Date: 04/13/2017    History of Present Illness Patient is a 41 yo female admitted 04/12/17 with numbness of Lt face and LUE.  NIH = 2.   MRI negative.     PMH:  HTN   Clinical Impression   PTA, pt was independent with ADL and functional mobility and was working as a Location managermachine operator. Pt currently demonstrates ability to complete ADL in hospital setting with modified independence. She does demonstrate slight instability when completing functional mobility but compensates well. She reports that all L UE deficits have resolved and [facility] strength and sensation. No further acute OT needs identified. Educated pt on stroke warning signs. OT will sign off.     Follow Up Recommendations  No OT follow up    Equipment Recommendations  None recommended by OT    Recommendations for Other Services       Precautions / Restrictions Precautions Precautions: None Restrictions Weight Bearing Restrictions: No      Mobility Bed Mobility Overal bed mobility: Independent                Transfers Overall transfer level: Modified independent Equipment used: None             General transfer comment: Increased time    Balance Overall balance assessment: No apparent balance deficits (not formally assessed)                                         ADL either performed or assessed with clinical judgement   ADL Overall ADL's : Modified independent                                       General ADL Comments: Able to complete all ADL with modified independence this session. She does demonstrate some instability with L LE but compensates well.      Vision Baseline Vision/History: Wears glasses Wears Glasses: At all times (reports "I'm supposed to wear them all the time but I do not") Patient Visual Report: No change from  baseline Vision Assessment?: Yes Eye Alignment: Within Functional Limits Ocular Range of Motion: Within Functional Limits Alignment/Gaze Preference: Within Defined Limits Tracking/Visual Pursuits: Able to track stimulus in all quads without difficulty Saccades: Within functional limits Convergence: Within functional limits Visual Fields: No apparent deficits     Perception     Praxis      Pertinent Vitals/Pain Pain Assessment: No/denies pain     Hand Dominance Right   Extremity/Trunk Assessment Upper Extremity Assessment Upper Extremity Assessment: Overall WFL for tasks assessed;RUE deficits/detail RUE Deficits / Details: L UE strength and sensation deficits have resolved.   Lower Extremity Assessment Lower Extremity Assessment: Defer to PT evaluation   Cervical / Trunk Assessment Cervical / Trunk Assessment: Normal   Communication Communication Communication: No difficulties   Cognition Arousal/Alertness: Awake/alert Behavior During Therapy: WFL for tasks assessed/performed Overall Cognitive Status: Within Functional Limits for tasks assessed                                     General Comments       Exercises     Shoulder Instructions  Home Living Family/patient expects to be discharged to:: Private residence Living Arrangements: Spouse/significant other;Children Available Help at Discharge: Family;Available PRN/intermittently Type of Home: Apartment Home Access: Stairs to enter Entrance Stairs-Number of Steps: 3 flights Entrance Stairs-Rails: Right Home Layout: One level     Bathroom Shower/Tub: Chief Strategy Officer: Standard     Home Equipment: None          Prior Functioning/Environment Level of Independence: Independent                 OT Problem List: Decreased strength;Decreased activity tolerance      OT Treatment/Interventions:      OT Goals(Current goals can be found in the care plan section)  Acute Rehab OT Goals Patient Stated Goal: To go home OT Goal Formulation: With patient Time For Goal Achievement: 04/27/17 Potential to Achieve Goals: Good  OT Frequency:     Barriers to D/C:            Co-evaluation              AM-PAC PT "6 Clicks" Daily Activity     Outcome Measure Help from another person eating meals?: None Help from another person taking care of personal grooming?: None Help from another person toileting, which includes using toliet, bedpan, or urinal?: None Help from another person bathing (including washing, rinsing, drying)?: None Help from another person to put on and taking off regular upper body clothing?: None Help from another person to put on and taking off regular lower body clothing?: None 6 Click Score: 24   End of Session    Activity Tolerance: Patient tolerated treatment well Patient left: in bed;with call bell/phone within reach  OT Visit Diagnosis: Unsteadiness on feet (R26.81)                Time: 6962-9528 OT Time Calculation (min): 14 min Charges:  OT General Charges $OT Visit: 1 Procedure OT Evaluation $OT Eval Moderate Complexity: 1 Procedure G-Codes: OT G-codes **NOT FOR INPATIENT CLASS** Functional Assessment Tool Used: AM-PAC 6 Clicks Daily Activity Functional Limitation: Self care Self Care Current Status (U1324): 0 percent impaired, limited or restricted Self Care Goal Status (M0102): 0 percent impaired, limited or restricted Self Care Discharge Status (V2536): 0 percent impaired, limited or restricted   Doristine Section, MS OTR/L  Pager: [phone]   Willis Holquin [facility] 04/13/2017, 9:48 AM

## 2017-04-13 NOTE — Progress Notes (Signed)
Medical records request faxed to medical records, successful fax copy recd.

## 2017-04-13 NOTE — Progress Notes (Signed)
*  PRELIMINARY RESULTS* Vascular Ultrasound Carotid Duplex (Doppler) has been completed.  Preliminary findings: Bilateral 1-39% ICA stenosis, antegrade vertebral flow.   Chauncey FischerCharlotte C Amaan Meyer 04/13/2017, 9:41 AM

## 2017-04-14 LAB — HEMOGLOBIN A1C
HEMOGLOBIN A1C: 5 % (ref 4.8–5.6)
MEAN PLASMA GLUCOSE: 97 mg/dL

## 2017-04-20 NOTE — Discharge Summary (Signed)
[facility] Discharge Summary   Patient: Bianca Villa ZOX:[id number]   PCP: [name], [name] DOB: [birth year]   Date of admission: 04/12/2017   Date of discharge: 04/13/2017    Discharge Diagnoses:  Active Problems:   Stroke-like episode (HCC)   Admitted From: home Disposition:  home  Recommendations for Outpatient Follow-up:  Please follow up with PCP in 1 week   Follow-up Information    [name], PA. Schedule an appointment as soon as possible for a visit in 1 week(s).   Specialty:  General Practice Contact information: [address] [place] [address] [phone]          Diet recommendation: cardiac diet  Activity: The patient is advised to gradually reintroduce usual activities.  Discharge Condition: good  Code Status: full code  History of present illness: As per the H and P dictated on admission, "Sameria Mullen is a 41 y.o. female past history significant for hypertension. Pt awoke with LUE numbness, went back to sleep thinking she slept on it. Felt it resolved but then symptoms recurred with left upper and lower extremity numbness and facial numbness and left-sided weakness as well. Patient then went to the emergency room for evaluation.  No family history of stroke. Denies tobacco history."  Hospital Course:  Summary of her active problems in the hospital is as following. Active Problems:   Stroke-like episode (HCC) left sided numbness  Resolved   CT head negative for acute bleed MRI/MRA negative Work up also unremarkable.  No further work up needed.   All other chronic medical condition were stable during the hospitalization.  Patient was seen by physical therapy, who recommended no PT follow up needed  On the day of the discharge the patient's vitals were stable , and no other acute medical condition were reported by patient. the patient was felt safe to be discharge at home with family.  Procedures and Results:  none    Consultations:  Neurology   DISCHARGE MEDICATION: Discharge Medication List as of 04/13/2017 10:04 AM    CONTINUE these medications which have CHANGED   Details  amLODipine (NORVASC) 5 MG tablet Take 1 tablet (5 mg total) by mouth daily., Starting Sun 04/13/2017, Normal       Allergies  Allergen Reactions  . Atenolol Other (See Comments)    unknown  . Potassium-Containing Compounds Rash  . Sulfa Antibiotics Rash   Discharge Instructions    Diet - low sodium heart healthy    Complete by:  As directed    Discharge instructions    Complete by:  As directed    It is important that you read following instructions as well as go over your medication list with RN to help you understand your care after this hospitalization.  Discharge Instructions: Please follow-up with PCP in one week  Please request your primary care physician to go over all Hospital Tests and Procedure/Radiological results at the follow up,  Please get all Hospital records sent to your PCP by signing hospital release before you go home.   Do not drive, operating heavy machinery, perform activities at heights, swimming or participation in water activities or provide baby sitting services; until you have been seen by Primary Care Physician or a Neurologist and advised to do so again. Do not take more than prescribed Pain, Sleep and Anxiety Medications. You were cared for by a hospitalist during your hospital stay. If you have any questions about your discharge medications or the care you  received while you were in the hospital after you are discharged, you can call the unit and ask to speak with the hospitalist on call if the hospitalist that took care of you is not available.  Once you are discharged, your primary care physician will handle any further medical issues. Please note that NO REFILLS for any discharge medications will be authorized once you are discharged, as it is imperative that you return to your  primary care physician (or establish a relationship with a primary care physician if you do not have one) for your aftercare needs so that they can reassess your need for medications and monitor your lab values. You Must read complete instructions/literature along with all the possible adverse reactions/side effects for all the Medicines you take and that have been prescribed to you. Take any new Medicines after you have completely understood and accept all the possible adverse reactions/side effects. Wear Seat belts while driving. If you have smoked or chewed Tobacco in the last 2 yrs please stop smoking and/or stop any Recreational drug use.   Increase activity slowly    Complete by:  As directed      Discharge Exam: Filed Weights   04/12/17 1049  Weight: 78.5 kg (173 lb)   Vitals:   04/13/17 0400 04/13/17 0600  BP: 121/78 128/82  Pulse:    Resp: 18 18  Temp: 98.4 F (36.9 C) 98.2 F (36.8 C)   General: Appear in no distress, no Rash; Oral Mucosa moist. Cardiovascular: S1 and S2 Present, no Murmur, no JVD Respiratory: Bilateral Air entry present and Clear to Auscultation, no Crackles, no wheezes Abdomen: Bowel Sound present, Soft and no tenderness Extremities: no Pedal edema, no calf tenderness Neurology: Grossly no focal neuro deficit.  The results of significant diagnostics from this hospitalization (including imaging, microbiology, ancillary and laboratory) are listed below for reference.    Significant Diagnostic Studies: Mr Brain Wo Contrast  Result Date: 04/12/2017 CLINICAL DATA:  Initial evaluation for acute left sided weakness and numbness. EXAM: MRI HEAD WITHOUT CONTRAST MRA HEAD WITHOUT CONTRAST TECHNIQUE: Multiplanar, multiecho pulse sequences of the brain and surrounding structures were obtained without intravenous contrast. Angiographic images of the head were obtained using MRA technique without contrast. COMPARISON:  Prior CT from earlier the same day. FINDINGS: MRI  HEAD FINDINGS Brain: Cerebral volume within normal limits. No focal parenchymal signal abnormality identified. No significant cerebral white matter disease. No abnormal foci of restricted diffusion to suggest acute or subacute ischemia. Gray-white matter differentiation maintained. No evidence encephalomalacia to suggest chronic infarction. No evidence for acute or chronic intracranial hemorrhage. No mass lesion, midline shift or mass effect. Ventricles normal size without evidence for hydrocephalus. No extra-axial fluid collection. Major dural sinuses are grossly patent. Pituitary gland suprasellar region within normal limits. Midline structures intact and normal. Vascular: Major intracranial vascular flow voids are well maintained. Skull and upper cervical spine: Mild cerebellar tonsillar ectopia of approximately 4 mm without frank Chiari malformation. Craniocervical junction otherwise unremarkable. Visualized upper cervical spine within normal limits. Bone marrow signal intensity within normal limits. No scalp soft tissue abnormality. Sinuses/Orbits: Globes and orbital soft tissues within normal limits. Mild scattered mucosal thickening within the ethmoidal air cells and maxillary sinuses. Paranasal sinuses are otherwise clear. No mastoid effusion. Inner ear structures normal. MRA HEAD FINDINGS ANTERIOR CIRCULATION: Distal cervical segments of the internal carotid arteries are widely patent with antegrade flow. Petrous, cavernous, and supraclinoid segments widely patent without flow-limiting stenosis. Ophthalmic arteries patent. ICA termini widely  patent. A1 segments, anterior communicating artery, anterior cerebral artery use well opacified and normal in appearance. M1 segments widely patent without stenosis or occlusion. No proximal M2 occlusion. Distal MCA branches well opacified and symmetric. POSTERIOR CIRCULATION: Vertebral artery use widely patent to the vertebrobasilar junction without stenosis. Right  vertebral artery dominant. Basilar artery widely patent to its distal aspect. Superior cerebral arteries patent bilaterally. Posterior cerebral arteries primarily supplied via the basilar artery and are widely patent to their distal aspects. Small bilateral posterior communicating arteries noted, slightly larger on the left. No aneurysm or vascular malformation. IMPRESSION: 1. Normal MRI of the brain. No acute intracranial process identified. 2. Normal intracranial MRA. Electronically Signed   By: Rise Mu M.D.   On: 04/12/2017 22:47   Mr Maxine Glenn Head/brain XL Cm  Result Date: 04/12/2017 CLINICAL DATA:  Initial evaluation for acute left sided weakness and numbness. EXAM: MRI HEAD WITHOUT CONTRAST MRA HEAD WITHOUT CONTRAST TECHNIQUE: Multiplanar, multiecho pulse sequences of the brain and surrounding structures were obtained without intravenous contrast. Angiographic images of the head were obtained using MRA technique without contrast. COMPARISON:  Prior CT from earlier the same day. FINDINGS: MRI HEAD FINDINGS Brain: Cerebral volume within normal limits. No focal parenchymal signal abnormality identified. No significant cerebral white matter disease. No abnormal foci of restricted diffusion to suggest acute or subacute ischemia. Gray-white matter differentiation maintained. No evidence encephalomalacia to suggest chronic infarction. No evidence for acute or chronic intracranial hemorrhage. No mass lesion, midline shift or mass effect. Ventricles normal size without evidence for hydrocephalus. No extra-axial fluid collection. Major dural sinuses are grossly patent. Pituitary gland suprasellar region within normal limits. Midline structures intact and normal. Vascular: Major intracranial vascular flow voids are well maintained. Skull and upper cervical spine: Mild cerebellar tonsillar ectopia of approximately 4 mm without frank Chiari malformation. Craniocervical junction otherwise unremarkable. Visualized  upper cervical spine within normal limits. Bone marrow signal intensity within normal limits. No scalp soft tissue abnormality. Sinuses/Orbits: Globes and orbital soft tissues within normal limits. Mild scattered mucosal thickening within the ethmoidal air cells and maxillary sinuses. Paranasal sinuses are otherwise clear. No mastoid effusion. Inner ear structures normal. MRA HEAD FINDINGS ANTERIOR CIRCULATION: Distal cervical segments of the internal carotid arteries are widely patent with antegrade flow. Petrous, cavernous, and supraclinoid segments widely patent without flow-limiting stenosis. Ophthalmic arteries patent. ICA termini widely patent. A1 segments, anterior communicating artery, anterior cerebral artery use well opacified and normal in appearance. M1 segments widely patent without stenosis or occlusion. No proximal M2 occlusion. Distal MCA branches well opacified and symmetric. POSTERIOR CIRCULATION: Vertebral artery use widely patent to the vertebrobasilar junction without stenosis. Right vertebral artery dominant. Basilar artery widely patent to its distal aspect. Superior cerebral arteries patent bilaterally. Posterior cerebral arteries primarily supplied via the basilar artery and are widely patent to their distal aspects. Small bilateral posterior communicating arteries noted, slightly larger on the left. No aneurysm or vascular malformation. IMPRESSION: 1. Normal MRI of the brain. No acute intracranial process identified. 2. Normal intracranial MRA. Electronically Signed   By: Rise Mu M.D.   On: 04/12/2017 22:47   Ct Head Code Stroke W/o Cm  Result Date: 04/12/2017 CLINICAL DATA:  Code stroke. 41 year old female with left side facial numbness, left extremity numbness and weakness. Headache for 1 week. EXAM: CT HEAD WITHOUT CONTRAST TECHNIQUE: Contiguous axial images were obtained from the base of the skull through the vertex without intravenous contrast. COMPARISON:  None.  FINDINGS: Brain: Normal cerebral volume. No  midline shift, ventriculomegaly, mass effect, evidence of mass lesion, intracranial hemorrhage or evidence of cortically based acute infarction. Gray-white matter differentiation is within normal limits throughout the brain. Vascular: No suspicious intracranial vascular hyperdensity. Skull: No osseous abnormality identified. Sinuses/Orbits: Clear. Other: Visualized orbits and scalp soft tissues are within normal limits. ASPECTS [facility] Stroke Program Early CT Score) - Ganglionic level infarction (caudate, lentiform nuclei, internal capsule, insula, M1-M3 cortex): 7 - Supraganglionic infarction (M4-M6 cortex): 3 Total score (0-10 with 10 being normal): 10 IMPRESSION: 1. Normal noncontrast CT appearance of the brain. 2. ASPECTS is 10. 3. Study discussed by telephone with Dr. Linwood Dibbles on 04/12/2017 at 11:05 . Electronically Signed   By: Odessa Fleming M.D.   On: 04/12/2017 11:06   Time spent: 35 minutes  Signed:  Lynden Oxford  [facility] 04/13/2017 , 5:26 PM

## 2020-06-30 ENCOUNTER — Emergency Department (HOSPITAL_BASED_OUTPATIENT_CLINIC_OR_DEPARTMENT_OTHER): Payer: BC Managed Care – PPO

## 2020-06-30 ENCOUNTER — Observation Stay (HOSPITAL_BASED_OUTPATIENT_CLINIC_OR_DEPARTMENT_OTHER)
Admission: EM | Admit: 2020-06-30 | Discharge: 2020-07-01 | Disposition: A | Discharge status: Home / Self Care | Payer: BC Managed Care – PPO | Attending: Internal Medicine | Admitting: Internal Medicine

## 2020-06-30 ENCOUNTER — Other Ambulatory Visit: Payer: Self-pay

## 2020-06-30 ENCOUNTER — Encounter (HOSPITAL_BASED_OUTPATIENT_CLINIC_OR_DEPARTMENT_OTHER): Payer: Self-pay

## 2020-06-30 ENCOUNTER — Observation Stay (HOSPITAL_COMMUNITY): Payer: BC Managed Care – PPO

## 2020-06-30 DIAGNOSIS — Z20822 Contact with and (suspected) exposure to covid-19: Secondary | ICD-10-CM | POA: Insufficient documentation

## 2020-06-30 DIAGNOSIS — R531 Weakness: Secondary | ICD-10-CM | POA: Diagnosis present

## 2020-06-30 DIAGNOSIS — G459 Transient cerebral ischemic attack, unspecified: Secondary | ICD-10-CM | POA: Diagnosis present

## 2020-06-30 DIAGNOSIS — G4459 Other complicated headache syndrome: Secondary | ICD-10-CM | POA: Diagnosis not present

## 2020-06-30 DIAGNOSIS — R200 Anesthesia of skin: Secondary | ICD-10-CM

## 2020-06-30 DIAGNOSIS — R202 Paresthesia of skin: Secondary | ICD-10-CM

## 2020-06-30 DIAGNOSIS — I10 Essential (primary) hypertension: Secondary | ICD-10-CM

## 2020-06-30 DIAGNOSIS — Z79899 Other long term (current) drug therapy: Secondary | ICD-10-CM | POA: Diagnosis not present

## 2020-06-30 DIAGNOSIS — R2689 Other abnormalities of gait and mobility: Secondary | ICD-10-CM | POA: Diagnosis not present

## 2020-06-30 DIAGNOSIS — R519 Headache, unspecified: Secondary | ICD-10-CM

## 2020-06-30 LAB — DIFFERENTIAL
Abs Immature Granulocytes: 0.02 10*3/uL (ref 0.00–0.07)
Basophils Absolute: 0 10*3/uL (ref 0.0–0.1)
Basophils Relative: 1 %
Eosinophils Absolute: 0.2 10*3/uL (ref 0.0–0.5)
Eosinophils Relative: 3 %
Immature Granulocytes: 0 %
Lymphocytes Relative: 41 %
Lymphs Abs: 2.5 10*3/uL (ref 0.7–4.0)
Monocytes Absolute: 0.6 10*3/uL (ref 0.1–1.0)
Monocytes Relative: 10 %
Neutro Abs: 2.7 10*3/uL (ref 1.7–7.7)
Neutrophils Relative %: 45 %

## 2020-06-30 LAB — COMPREHENSIVE METABOLIC PANEL
ALT: 14 U/L (ref 0–44)
AST: 15 U/L (ref 15–41)
Albumin: 4 g/dL (ref 3.5–5.0)
Alkaline Phosphatase: 38 U/L (ref 38–126)
Anion gap: 8 (ref 5–15)
BUN: 8 mg/dL (ref 6–20)
CO2: 19 mmol/L — ABNORMAL LOW (ref 22–32)
Calcium: 8.7 mg/dL — ABNORMAL LOW (ref 8.9–10.3)
Chloride: 109 mmol/L (ref 98–111)
Creatinine, Ser: 0.7 mg/dL (ref 0.44–1.00)
GFR calc Af Amer: >60 mL/min (ref >60)
GFR calc non Af Amer: >60 mL/min (ref >60)
Glucose, Bld: 89 mg/dL (ref 70–99)
Potassium: 3.5 mmol/L (ref 3.5–5.1)
Sodium: 136 mmol/L (ref 135–145)
Total Bilirubin: 1 mg/dL (ref 0.3–1.2)
Total Protein: 7.7 g/dL (ref 6.5–8.1)

## 2020-06-30 LAB — CBC
HCT: 37.4 % (ref 36.0–46.0)
Hemoglobin: 12.4 g/dL (ref 12.0–15.0)
MCH: 32.2 pg (ref 26.0–34.0)
MCHC: 33.2 g/dL (ref 30.0–36.0)
MCV: 97.1 fL (ref 80.0–100.0)
Platelets: 375 K/uL (ref 150–400)
RBC: 3.85 MIL/uL — ABNORMAL LOW (ref 3.87–5.11)
RDW: 12.1 % (ref 11.5–15.5)
WBC: 6.1 K/uL (ref 4.0–10.5)
nRBC: 0 % (ref 0.0–0.2)

## 2020-06-30 LAB — URINALYSIS, ROUTINE W REFLEX MICROSCOPIC
Bilirubin Urine: NEGATIVE
Glucose, UA: NEGATIVE mg/dL
Hgb urine dipstick: NEGATIVE
Ketones, ur: NEGATIVE mg/dL
Leukocytes,Ua: NEGATIVE
Nitrite: NEGATIVE
Protein, ur: NEGATIVE mg/dL
Specific Gravity, Urine: 1.01 (ref 1.005–1.030)
pH: 6.5 (ref 5.0–8.0)

## 2020-06-30 LAB — RAPID URINE DRUG SCREEN, HOSP PERFORMED
Amphetamines: NOT DETECTED
Barbiturates: NOT DETECTED
Benzodiazepines: NOT DETECTED
Cocaine: NOT DETECTED
Opiates: NOT DETECTED
Tetrahydrocannabinol: NOT DETECTED

## 2020-06-30 LAB — SARS CORONAVIRUS 2 BY RT PCR (HOSPITAL ORDER, PERFORMED IN ~~LOC~~ HOSPITAL LAB): SARS Coronavirus 2: NEGATIVE

## 2020-06-30 LAB — CBG MONITORING, ED: Glucose-Capillary: 77 mg/dL (ref 70–99)

## 2020-06-30 LAB — PREGNANCY, URINE: Preg Test, Ur: NEGATIVE

## 2020-06-30 MED ORDER — SODIUM CHLORIDE 0.9% FLUSH
3.0000 mL | Freq: Two times a day (BID) | INTRAVENOUS | Status: DC
  Administered 2020-06-30 – 2020-07-01 (×2): 3 mL via INTRAVENOUS

## 2020-06-30 MED ORDER — DIPHENHYDRAMINE HCL 50 MG/ML IJ SOLN
25.0000 mg | Freq: Once | INTRAMUSCULAR | Status: AC
  Administered 2020-06-30: 25 mg via INTRAVENOUS
  Filled 2020-06-30: qty 1

## 2020-06-30 MED ORDER — PROCHLORPERAZINE EDISYLATE 10 MG/2ML IJ SOLN
10.0000 mg | Freq: Once | INTRAMUSCULAR | Status: AC
  Administered 2020-06-30: 10 mg via INTRAVENOUS
  Filled 2020-06-30: qty 2

## 2020-06-30 MED ORDER — ACETAMINOPHEN 650 MG RE SUPP: 650.0000 mg | Freq: Four times a day (QID) | RECTAL | Status: DC | PRN

## 2020-06-30 MED ORDER — SODIUM CHLORIDE 0.9% FLUSH: 3.0000 mL | Freq: Two times a day (BID) | INTRAVENOUS | Status: DC

## 2020-06-30 MED ORDER — ACETAMINOPHEN 325 MG PO TABS: 650.0000 mg | ORAL_TABLET | Freq: Four times a day (QID) | ORAL | Status: DC | PRN

## 2020-06-30 MED ORDER — ENOXAPARIN SODIUM 40 MG/0.4ML ~~LOC~~ SOLN
40.0000 mg | SUBCUTANEOUS | Status: DC
  Administered 2020-06-30: 40 mg via SUBCUTANEOUS
  Filled 2020-06-30: qty 0.4

## 2020-06-30 MED ORDER — SODIUM CHLORIDE 0.9 % IV SOLN: 250.0000 mL | INTRAVENOUS | Status: DC | PRN

## 2020-06-30 MED ORDER — IOHEXOL 350 MG/ML SOLN
100.0000 mL | Freq: Once | INTRAVENOUS | Status: AC | PRN
  Administered 2020-06-30: 100 mL via INTRAVENOUS

## 2020-06-30 MED ORDER — ONDANSETRON HCL 4 MG/2ML IJ SOLN: 4.0000 mg | Freq: Four times a day (QID) | INTRAMUSCULAR | Status: DC | PRN

## 2020-06-30 MED ORDER — SODIUM CHLORIDE 0.9% FLUSH: 3.0000 mL | INTRAVENOUS | Status: DC | PRN

## 2020-06-30 MED ORDER — ASPIRIN EC 81 MG PO TBEC
81.0000 mg | DELAYED_RELEASE_TABLET | Freq: Every day | ORAL | Status: DC
  Administered 2020-06-30 – 2020-07-01 (×2): 81 mg via ORAL
  Filled 2020-06-30 (×2): qty 1

## 2020-06-30 MED ORDER — SODIUM CHLORIDE 0.9 % IV BOLUS
1000.0000 mL | Freq: Once | INTRAVENOUS | Status: AC
  Administered 2020-06-30: 1000 mL via INTRAVENOUS

## 2020-06-30 MED ORDER — ONDANSETRON HCL 4 MG PO TABS: 4.0000 mg | ORAL_TABLET | Freq: Four times a day (QID) | ORAL | Status: DC | PRN

## 2020-06-30 MED ORDER — KETOROLAC TROMETHAMINE 15 MG/ML IJ SOLN: 15.0000 mg | Freq: Four times a day (QID) | INTRAMUSCULAR | Status: DC | PRN

## 2020-06-30 NOTE — ED Triage Notes (Signed)
Pt arrives with c/o numbness to face,left arm,and leg with hand drawing up at work around 3 am today. Pt reports severe headache around 1 am today, she was awoken from sleep with headache. Pt reports going to be around 930 pm last night without headache or symptoms.

## 2020-06-30 NOTE — ED Provider Notes (Signed)
[facility] EMERGENCY DEPARTMENT Provider Note   CSN: [id number] Arrival date & time: 06/30/20  1010     History Chief Complaint  Patient presents with  . Weakness    Bianca Villa is a 44 y.o. female.  HPI      Presents with concern for numbness, drawing up of left, headache Hx of CVA 5 years ago  Woke up at 1AM with headache, still there at 3am 6AM was at work and began to have left sided numbness/weakness. Had reported numbness began around 3AM at triage. No difficulty talking Blurred both eye vision at work now resolved Now headache and fatigue, feels like left side still weak. Was dragging leg at work, Numbness improved Left arm with cramping, drawn up for about 20 minutes   Past Medical History:  Diagnosis Date  . Hypertension     Patient Active Problem List   Diagnosis Date Noted  . Paresthesia 06/30/2020  . HTN (hypertension) 06/30/2020  . Headache 06/30/2020  . Stroke-like episode 04/12/2017    Past Surgical History:  Procedure Laterality Date  . BREAST SURGERY    . CHOLECYSTECTOMY       OB History   No obstetric history on file.     Family History  Problem Relation Age of Onset  . Diabetes Sister     Social History   Tobacco Use  . Smoking status: Never Smoker  . Smokeless tobacco: Never Used  Substance Use Topics  . Alcohol use: Never  . Drug use: Never    Home Medications Prior to Admission medications   Medication Sig Start Date End Date Taking? Authorizing Provider  amLODipine (NORVASC) 10 MG tablet Take 10 mg by mouth at bedtime.  04/14/20  Yes [provider]  carvedilol (COREG) 3.125 MG tablet Take 3.125 mg by mouth 2 (two) times daily. 06/07/20  Yes [provider]  Vitamin D, Ergocalciferol, (DRISDOL) 1.25 MG (50000 UNIT) CAPS capsule Take 50,000 Units by mouth once a week. 06/02/20  Yes [provider]    Allergies    Atenolol, Losartan, Other, Potassium-containing compounds,  Sulfa antibiotics, and Sulfasalazine  Review of Systems   Review of Systems  Constitutional: Negative for fever.  HENT: Negative for sore throat.   Eyes: Negative for visual disturbance.  Respiratory: Negative for cough and shortness of breath.   Cardiovascular: Negative for chest pain.  Gastrointestinal: Negative for abdominal pain, nausea and vomiting.  Genitourinary: Negative for difficulty urinating.  Musculoskeletal: Negative for back pain and neck pain.  Skin: Negative for rash.  Neurological: Positive for weakness, numbness and headaches. Negative for dizziness, syncope, facial asymmetry, speech difficulty and light-headedness.    Physical Exam Updated Vital Signs BP 101/62 (BP Location: Right Arm)   Pulse 63   Temp 98.4 F (36.9 C) (Oral)   Resp 17   Ht 5\' 2"  (1.575 m)   Wt 79.4 kg   LMP 06/23/2020   SpO2 100%   BMI 32.02 kg/m   Physical Exam Constitutional:      General: She is not in acute distress.    Appearance: Normal appearance. She is not ill-appearing.  HENT:     Head: Normocephalic and atraumatic.  Eyes:     General: No visual field deficit.    Extraocular Movements: Extraocular movements intact.     Conjunctiva/sclera: Conjunctivae normal.     Pupils: Pupils are equal, round, and reactive to light.  Cardiovascular:     Rate and Rhythm: Normal rate and regular rhythm.  Pulses: Normal pulses.  Pulmonary:     Effort: Pulmonary effort is normal. No respiratory distress.  Musculoskeletal:        General: No swelling or tenderness.     Cervical back: Normal range of motion.  Skin:    General: Skin is warm and dry.     Findings: No erythema or rash.  Neurological:     General: No focal deficit present.     Mental Status: She is alert and oriented to person, place, and time.     GCS: GCS eye subscore is 4. GCS verbal subscore is 5. GCS motor subscore is 6.     Cranial Nerves: No cranial nerve deficit, dysarthria or facial asymmetry.     Sensory:  Sensory deficit (left sided) present.     Motor: Weakness (left sided pronator drift, LE weakness) present. No tremor.     Coordination: Coordination normal. Finger-Nose-Finger Test normal.     Gait: Gait normal.     ED Results / Procedures / Treatments   Labs (all labs ordered are listed, but only abnormal results are displayed) Labs Reviewed  CBC - Abnormal; Notable for the following components:      Result Value   RBC 3.85 (*)    All other components within normal limits  COMPREHENSIVE METABOLIC PANEL - Abnormal; Notable for the following components:   CO2 19 (*)    Calcium 8.7 (*)    All other components within normal limits  SARS CORONAVIRUS 2 BY RT PCR (HOSPITAL ORDER, PERFORMED IN [facility])  DIFFERENTIAL  RAPID URINE DRUG SCREEN, HOSP PERFORMED  URINALYSIS, ROUTINE W REFLEX MICROSCOPIC  PREGNANCY, URINE  HIV ANTIBODY (ROUTINE TESTING W REFLEX)  HEMOGLOBIN A1C  LIPID PANEL  CBG MONITORING, ED    EKG None  Radiology MR BRAIN WO CONTRAST  Result Date: 06/30/2020 CLINICAL DATA:  Acute neuro deficit.  Rule out stroke EXAM: MRI HEAD WITHOUT CONTRAST TECHNIQUE: Multiplanar, multiecho pulse sequences of the brain and surrounding structures were obtained without intravenous contrast. COMPARISON:  CT angio head and neck 06/30/2020 FINDINGS: Brain: No acute infarction, hemorrhage, hydrocephalus, extra-axial collection or mass lesion. Cerebellar tonsils approximately 5 mm below the foramen magnum compatible with tonsillar ectopia. No impaction or syrinx in the cervical cord. Normal white matter. No significant chronic ischemia or demyelinating disease. Vascular: Normal arterial flow voids. Skull and upper cervical spine: No focal skeletal lesion. Sinuses/Orbits: Mild mucosal edema paranasal sinuses. Negative orbit Other: None IMPRESSION: No acute abnormality Low lying cerebellar tonsils unchanged from prior studies compatible with tonsillar ectopia. Electronically  Signed   By: Marlan Palau M.D.   On: 06/30/2020 19:17   CT ANGIO HEAD CODE STROKE  Result Date: 06/30/2020 CLINICAL DATA:  Stroke suspected.  Weakness/numbness on left. EXAM: CT ANGIOGRAPHY HEAD AND NECK TECHNIQUE: Multidetector CT imaging of the head and neck was performed using the standard protocol during bolus administration of intravenous contrast. Multiplanar CT image reconstructions and MIPs were obtained to evaluate the vascular anatomy. Carotid stenosis measurements (when applicable) are obtained utilizing NASCET criteria, using the distal internal carotid diameter as the denominator. CONTRAST:  OMNIPAQUE IOHEXOL 350 MG/ML SOLN COMPARISON:  MRI/MRA and CT from 05/12/2017 FINDINGS: CT HEAD FINDINGS Brain: No evidence of acute infarction, hemorrhage, hydrocephalus, extra-axial collection or mass lesion/mass effect. 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 Vascular: Evaluated on CTA portion. Skull: Normal. Negative for fracture or focal lesion. Sinuses: Imaged portions are clear.  Orbits: No acute finding. Review of the MIP images confirms the above findings CTA NECK FINDINGS Aortic arch: Imaged portion shows no evidence of aneurysm or dissection. No significant stenosis of the major arch vessel origins. Right carotid system: No evidence of dissection, stenosis (50% or greater) or occlusion. Left carotid system: No evidence of dissection, stenosis (50% or greater) or occlusion. Vertebral arteries: Mildly right dominant. No evidence of dissection, stenosis (50% or greater) or occlusion. Skeleton: No acute abnormality. Other neck: No acute abnormality. Upper chest: No consolidation or pneumothorax. Mild mosaic attenuation. Review of the MIP images confirms the above findings CTA HEAD FINDINGS Anterior circulation: No significant stenosis, proximal occlusion, aneurysm, or vascular malformation. Diminutive right anterior temporal MCA  branch; however, this appears similar to prior MRA without definite focal stenosis. Posterior circulation: No significant stenosis, proximal occlusion, aneurysm, or vascular malformation. Venous sinuses: As permitted by contrast timing, patent. Review of the MIP images confirms the above findings IMPRESSION: 1. No acute intracranial abnormality.  No acute hemorrhage. 2. No substantial (greater than 50%) proximal stenosis or occlusion in the head or neck. Code stroke imaging results were communicated on 06/30/2020 at 12:21 pm to provider Dr. Dalene SeltzerSchlossman Via telephone, who verbally acknowledged these results. Electronically Signed   By: Feliberto HartsFrederick S Jones MD   On: 06/30/2020 12:28   CT ANGIO NECK CODE STROKE  Result Date: 06/30/2020 CLINICAL DATA:  Stroke suspected.  Weakness/numbness on left. EXAM: CT ANGIOGRAPHY HEAD AND NECK TECHNIQUE: Multidetector CT imaging of the head and neck was performed using the standard protocol during bolus administration of intravenous contrast. Multiplanar CT image reconstructions and MIPs were obtained to evaluate the vascular anatomy. Carotid stenosis measurements (when applicable) are obtained utilizing NASCET criteria, using the distal internal carotid diameter as the denominator. CONTRAST:  100mL OMNIPAQUE IOHEXOL 350 MG/ML SOLN COMPARISON:  MRI/MRA and CT from 05/12/2017 FINDINGS: CT HEAD FINDINGS Brain: No evidence of acute infarction, hemorrhage, hydrocephalus, extra-axial collection or mass lesion/mass effect. 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 Vascular: Evaluated on CTA portion. Skull: Normal. Negative for fracture or focal lesion. Sinuses: Imaged portions are clear. Orbits: No acute finding. Review of the MIP images confirms the above findings CTA NECK FINDINGS Aortic arch: Imaged portion shows no evidence of aneurysm or dissection. No significant stenosis of the major arch vessel origins. Right  carotid system: No evidence of dissection, stenosis (50% or greater) or occlusion. Left carotid system: No evidence of dissection, stenosis (50% or greater) or occlusion. Vertebral arteries: Mildly right dominant. No evidence of dissection, stenosis (50% or greater) or occlusion. Skeleton: No acute abnormality. Other neck: No acute abnormality. Upper chest: No consolidation or pneumothorax. Mild mosaic attenuation. Review of the MIP images confirms the above findings CTA HEAD FINDINGS Anterior circulation: No significant stenosis, proximal occlusion, aneurysm, or vascular malformation. Diminutive right anterior temporal MCA branch; however, this appears similar to prior MRA without definite focal stenosis. Posterior circulation: No significant stenosis, proximal occlusion, aneurysm, or vascular malformation. Venous sinuses: As permitted by contrast timing, patent. Review of the MIP images confirms the above findings IMPRESSION: 1. No acute intracranial abnormality.  No acute hemorrhage. 2. No substantial (greater than 50%) proximal stenosis or occlusion in the head or neck. Code stroke imaging results were communicated on 06/30/2020 at 12:21 pm to provider Dr. Dalene SeltzerSchlossman Via telephone, who verbally acknowledged these results. Electronically Signed   By: Feliberto HartsFrederick S Jones MD   On: 06/30/2020 12:28    Procedures  Procedures (including critical care time)  Medications Ordered in ED Medications  enoxaparin (LOVENOX) injection 40 mg (40 mg Subcutaneous Given 06/30/20 1828)  sodium chloride flush (NS) 0.9 % injection 3 mL (0 mLs Intravenous Duplicate 06/30/20 2156)  sodium chloride flush (NS) 0.9 % injection 3 mL (3 mLs Intravenous Given 06/30/20 2156)  sodium chloride flush (NS) 0.9 % injection 3 mL (has no administration in time range)  0.9 %  sodium chloride infusion (has no administration in time range)  acetaminophen (TYLENOL) tablet 650 mg (has no administration in time range)    Or  acetaminophen  (TYLENOL) suppository 650 mg (has no administration in time range)  ondansetron (ZOFRAN) tablet 4 mg (has no administration in time range)    Or  ondansetron (ZOFRAN) injection 4 mg (has no administration in time range)  ketorolac (TORADOL) 15 MG/ML injection 15 mg (has no administration in time range)  aspirin EC tablet 81 mg (81 mg Oral Given 06/30/20 1828)  iohexol (OMNIPAQUE) 350 MG/ML injection 100 mL (100 mLs Intravenous Contrast Given 06/30/20 1143)  sodium chloride 0.9 % bolus 1,000 mL ( Intravenous Stopped 06/30/20 1402)  prochlorperazine (COMPAZINE) injection 10 mg (10 mg Intravenous Given 06/30/20 1317)  diphenhydrAMINE (BENADRYL) injection 25 mg (25 mg Intravenous Given 06/30/20 1317)    ED Course  I have reviewed the triage vital signs and the nursing notes.  Pertinent labs & imaging results that were available during my care of the patient were reviewed by me and considered in my medical decision making (see chart for details).    MDM Rules/Calculators/A&P                          44yo female with history of hypertension, prior stroke-like episode in 2018, presents with concern for left sided numbness and weakness.  Out of tPA window but given significant weakness on exam ordered CTA and called Code Stroke.  CT without sign of large vessel occlusion. TeleNeurology evaluated and recommends inpatient admission and CVA work up. DDx continues to include complicated migraine given headache, however with weakness persistent on exam plan on admission for further work up.     Final Clinical Impression(s) / ED Diagnoses Final diagnoses:  Left-sided weakness  Left sided numbness  Nonintractable headache, unspecified chronicity pattern, unspecified headache type    Rx / DC Orders ED Discharge Orders    None       Alvira Monday, MD 06/30/20 2238

## 2020-06-30 NOTE — Progress Notes (Signed)
Pt arrived to unit at 1605 from [facility] ED transported by [facility]. Received report from [place], Charity fundraiser. Pt is A&O x4. Pt has peripheral IV to left AC; flushing without difficulty and saline locked. No skin issues noted. Weakness noted in arm left arm and left leg. Pt ambulated x1 assist without assistive device to restroom. Pt is on telemetry and oriented to unit. Call bell left within reach of pt. Bed left in lowest position with bed alarm set.

## 2020-06-30 NOTE — Consult Note (Signed)
[facility] Tele[facility]urology Consult Services   Date of Service:   06/30/2020 11:53:12  Impression:     .  R53.1 - Weakness     .  R20.2 - Paresthesia of skin     .  R51.9 - Headache, unspecified  Comments/Sign-Out: 44 year old woman with headache with photophobia, left sided weakness and paresthesias. Differential diagnosis includes right hemispheric stroke, or perhaps more likely complicated migraine. She does have history of similar presentation in 2018 after which she ultimately had negative MRI. She is outside of the time window for tPA.  CT head, CTA head and neck are negative for any acute processes, significant stenosis, or large vessel occlusion. Because she does have stroke risk factor (hypertension), and because she does have fairly significant weakness in her left leg and some ataxia in her left upper extremity, I would recommend admission for MRI, stroke workup.  Metrics: Last Known Well: 06/29/2020 21:00:00 [facility] Notification Time: 06/30/2020 11:53:12 Arrival Time: 06/30/2020 10:10:00 Stamp Time: 06/30/2020 11:53:12 Time First Login Attempt: 06/30/2020 11:56:02 Symptoms: Headache, left sided numbness. NIHSS Start Assessment Time: 06/30/2020 11:58:44 Patient is not a candidate for Thrombolytic. Thrombolytic Medical Decision: 06/30/2020 11:58:00 Patient was not deemed candidate for Thrombolytic because of following reasons: Last Well Known Above 4.5 Hours.  CT head showed no acute hemorrhage or acute core infarct. CT head was reviewed.  ED Physician notified of diagnostic impression and management plan on 06/30/2020 12:31:38  Advanced Imaging: CTA Head and Neck Completed.  LVO:No   Our recommendations are outlined below.  Recommendations:     .  Activate Stroke Protocol Admission/Order Set     .  Stroke/Telemetry Floor     .  Neuro Checks     .  Bedside Swallow Eval     .  DVT Prophylaxis     .  IV Fluids, Normal Saline     .  Head  of Bed 30 Degrees     .  Euglycemia and Avoid Hyperthermia (PRN Acetaminophen)     .  Initiate Aspirin     .  Initiate statin, for now, but if workup is ultimately negative, can likely discontinue aspirin and statin.  Routine Consultation with Inhouse Neurology for Follow up Care  Sign Out:     .  Discussed with Emergency Department Provider    ------------------------------------------------------------------------------  History of Present Illness: Patient is a 44 year old Female.  Patient was brought by private transportation with symptoms of Headache, left sided numbness.  44 year old woman with report of hypertension, reportedly controlled with medication, reports that she was last feeling well at 9 o'clock last night. She woke about one in the morning with a headache and some left sided tingling. When she woke up later this morning she had persistent symptoms and decided to come to the hospital. She feels tingling in her left face, arm, and hand and feels like her left side is weak. She also has a headache described as throbbing in the front and temples associated with some photophobia but no nausea. She reports she had similar symptoms a few years ago. Upon review of the chart she was in the hospital in 2018 and had a negative MRI of the brain at that time.   Past Medical History:     . Hypertension     . There is NO history of Diabetes Mellitus     . There is NO history of Hyperlipidemia  Social History: Smoking: No Alcohol Use: No Drug Use: No  Family  History:No clotting disorders, or vascular events at young age No fever/chills, nausea  Anticoagulant use:  No  Antiplatelet use: No    Examination: BP(121/86), Pulse(69), Blood Glucose(77) 1A: Level of Consciousness - Alert; keenly responsive + 0 1B: Ask Month and Age - Both Questions Right + 0 1C: Blink Eyes & Squeeze Hands - Performs Both Tasks + 0 2: Test Horizontal Extraocular Movements - Normal + 0 3: Test  Visual Fields - No Visual Loss + 0 4: Test Facial Palsy (Use Grimace if Obtunded) - Normal symmetry + 0 5A: Test Left Arm Motor Drift - Drift, but doesn't hit bed + 1 5B: Test Right Arm Motor Drift - No Drift for 10 Seconds + 0 6A: Test Left Leg Motor Drift - Drift, but doesn't hit bed + 1 6B: Test Right Leg Motor Drift - No Drift for 5 Seconds + 0 7: Test Limb Ataxia (FNF/Heel-Shin) - Ataxia in 1 Limb + 1 8: Test Sensation - Mild-Moderate Loss: Less Sharp/More Dull + 1 9: Test Language/Aphasia - Normal; No aphasia + 0 10: Test Dysarthria - Normal + 0 11: Test Extinction/Inattention - No abnormality + 0  NIHSS Score: 4  Pre-Morbid Modified Rankin Scale: 0 Points = No symptoms at all   Patient/Family was informed the Neurology Consult would occur via TeleHealth consult by way of interactive audio and video telecommunications and consented to receiving care in this manner.   Patient is being evaluated for possible acute neurologic impairment and high probability of imminent or life-threatening deterioration. I spent total of 30 minutes providing care to this patient, including time for face to face visit via telemedicine, review of medical records, imaging studies and discussion of findings with providers, the patient and/or family.   Dr Luana Shu   [facility] [phone]  Case [id number]

## 2020-06-30 NOTE — H&P (Signed)
History and Physical    Bianca Villa OJJ:[id number] DOB: 04/07/1976 DOA: 06/30/2020  PCP: [name], PA  Patient coming from: Home  Chief Complaint: Numbness to face, left arm, leg, headache   HPI: Bianca Villa is a 44 y.o. female with medical history significant of HTN who presented to the ED with chief complaint of numbness and headache. She states that she woke up around 1am with a frontal headache without visual changes, light or sound sensitivity. She went back to sleep, woke up at 3am to get ready for work. When she got to work, she started to feel left facial numbness, left arm and left leg numbness as well as weakness.  She states that in 2018 when she was diagnosed with stroke, it was similar in nature with left upper extremity numbness and weakness.  In review of her hospitalization in June 2018, at that time CT head, MRI/MRA were negative.  Neurology at that time felt this was suspected somatization and recommended stress recommendations/management.  ED Course: Due to concern for stroke, she underwent CTA head and neck which were negative for acute intracranial abnormality, substantial proximal stenosis or occlusion. Tele Neurology was consulted who recommended admission for MRI and stroke work up.   Review of Systems: As per HPI. Otherwise, all other review of systems reviewed and are negative.   Past Medical History:  Diagnosis Date  . Hypertension     Past Surgical History:  Procedure Laterality Date  . BREAST SURGERY    . CHOLECYSTECTOMY       reports that she has never smoked. She has never used smokeless tobacco. She reports that she does not drink alcohol and does not use drugs.  Allergies  Allergen Reactions  . Atenolol Other (See Comments)    unknown  . Losartan Cough  . Other Rash  . Potassium-Containing Compounds Rash  . Sulfa Antibiotics Rash  . Sulfasalazine Rash    Family History  Problem Relation Age of Onset  . Diabetes Sister      Prior to Admission medications   Medication Sig Start Date End Date Taking? Authorizing Provider  amLODipine (NORVASC) 10 MG tablet Take 10 mg by mouth at bedtime.  04/14/20  Yes [provider]  carvedilol (COREG) 3.125 MG tablet Take 3.125 mg by mouth 2 (two) times daily. 06/07/20  Yes [provider]  Vitamin D, Ergocalciferol, (DRISDOL) 1.25 MG (50000 UNIT) CAPS capsule Take 50,000 Units by mouth once a week. 06/02/20  Yes [provider]    Physical Exam: Vitals:   06/30/20 1258 06/30/20 1504 06/30/20 1607 06/30/20 1619  BP: 125/71 106/79 107/73 107/73  Pulse: 66 71 66 66  Resp: 14 17 18 18   Temp:  97.9 F (36.6 C) 98.4 F (36.9 C) 98.4 F (36.9 C)  TempSrc:  Oral Oral Oral  SpO2: 100% 100% 100% 100%  Weight:    79.4 kg  Height:    5\' 2"  (1.575 m)     Constitutional: NAD, calm, comfortable Eyes: PERRL, lids and conjunctivae normal ENMT: Mucous membranes are moist. Normal dentition.  Respiratory: Clear to auscultation bilaterally, no wheezing, no crackles. Normal respiratory effort. No accessory muscle use. No conversational dyspnea  Cardiovascular: Regular rate and rhythm, no murmurs. No extremity edema.  Abdomen: Soft, nondistended, nontender to palpation. Bowel sounds positive.  Musculoskeletal: No joint deformity upper and lower extremities. No contractures. Normal muscle tone.  Skin: no rashes, lesions, ulcers on exposed skin  Neurologic: Alert and oriented, speech fluent, CN 2-12  grossly intact. Left face, left upper and lower extremity with numbness and sensory deficit and decreased strength compared to right Psychiatric: Normal judgment and insight. Normal mood and affect   Labs on Admission: I have personally reviewed following labs and imaging studies  CBC: Recent Labs  Lab 06/30/20 1142  WBC 6.1  NEUTROABS 2.7  HGB 12.4  HCT 37.4  MCV 97.1  PLT 375   Basic Metabolic Panel: Recent Labs  Lab 06/30/20 1142  NA 136  K 3.5   CL 109  CO2 19*  GLUCOSE 89  BUN 8  CREATININE 0.70  CALCIUM 8.7*   GFR: Estimated Creatinine Clearance: 87.6 mL/min (by C-G formula based on SCr of 0.7 mg/dL). Liver Function Tests: Recent Labs  Lab 06/30/20 1142  AST 15  ALT 14  ALKPHOS 38  BILITOT 1.0  PROT 7.7  ALBUMIN 4.0   No results for input(s): LIPASE, AMYLASE in the last 168 hours. No results for input(s): AMMONIA in the last 168 hours. Coagulation Profile: No results for input(s): INR, PROTIME in the last 168 hours. Cardiac Enzymes: No results for input(s): CKTOTAL, CKMB, CKMBINDEX, TROPONINI in the last 168 hours. BNP (last 3 results) No results for input(s): PROBNP in the last 8760 hours. HbA1C: No results for input(s): HGBA1C in the last 72 hours. CBG: Recent Labs  Lab 06/30/20 1054  GLUCAP 77   Lipid Profile: No results for input(s): CHOL, HDL, LDLCALC, TRIG, CHOLHDL, LDLDIRECT in the last 72 hours. Thyroid Function Tests: No results for input(s): TSH, T4TOTAL, FREET4, T3FREE, THYROIDAB in the last 72 hours. Anemia Panel: No results for input(s): VITAMINB12, FOLATE, FERRITIN, TIBC, IRON, RETICCTPCT in the last 72 hours. Urine analysis:    Component Value Date/Time   COLORURINE YELLOW 06/30/2020 1142   APPEARANCEUR CLEAR 06/30/2020 1142   LABSPEC 1.010 06/30/2020 1142   PHURINE 6.5 06/30/2020 1142   GLUCOSEU NEGATIVE 06/30/2020 1142   HGBUR NEGATIVE 06/30/2020 1142   BILIRUBINUR NEGATIVE 06/30/2020 1142   KETONESUR NEGATIVE 06/30/2020 1142   PROTEINUR NEGATIVE 06/30/2020 1142   NITRITE NEGATIVE 06/30/2020 1142   LEUKOCYTESUR NEGATIVE 06/30/2020 1142   Sepsis Labs: !!!!!!!!!!!!!!!!!!!!!!!!!!!!!!!!!!!!!!!!!!!! @LABRCNTIP (procalcitonin:4,lacticidven:4) ) Recent Results (from the past 240 hour(s))  SARS Coronavirus 2 by RT PCR (hospital order[facility]) Nasopharyngeal Nasopharyngeal Swab     Status: None   Collection Time: 06/30/20  1:29 PM   Specimen:  Nasopharyngeal Swab  Result Value Ref Range Status   SARS Coronavirus 2 NEGATIVE NEGATIVE Final    Comment: (NOTE) SARS-CoV-2 target nucleic acids are NOT DETECTED.  The SARS-CoV-2 RNA is generally detectable in upper and lower respiratory specimens during the acute phase of infection. The lowest concentration of SARS-CoV-2 viral copies this assay can detect is 250 copies / mL. A negative result does not preclude SARS-CoV-2 infection and should not be used as the sole basis for treatment or other patient management decisions.  A negative result may occur with improper specimen collection / handling, submission of specimen other than nasopharyngeal swab, presence of viral mutation(s) within the areas targeted by this assay, and inadequate number of viral copies (<250 copies / mL). A negative result must be combined with clinical observations, patient history, and epidemiological information.  Fact Sheet for Patients:   BoilerBrush.com.cyhttps://www.fda.gov/media/136312/download  Fact Sheet for Healthcare Providers: https://pope.com/https://www.fda.gov/media/136313/download  This test is not yet approved or  cleared by the [place] FDA and has been authorized for detection and/or diagnosis of SARS-CoV-2 by FDA under an Emergency Use Authorization (  EUA).  This EUA will remain in effect (meaning this test can be used) for the duration of the COVID-19 declaration under Section 564(b)(1) of the Act, 21 U.S.C. section 360bbb-3(b)(1), unless the authorization is terminated or revoked sooner.  Performed at [facility], [address] [place], [place] [address]      Radiological Exams on Admission: CT ANGIO HEAD CODE STROKE  Result Date: 06/30/2020 CLINICAL DATA:  Stroke suspected.  Weakness/numbness on left. EXAM: CT ANGIOGRAPHY HEAD AND NECK TECHNIQUE: Multidetector CT imaging of the head and neck was performed using the standard protocol during bolus administration of intravenous contrast.  Multiplanar CT image reconstructions and MIPs were obtained to evaluate the vascular anatomy. Carotid stenosis measurements (when applicable) are obtained utilizing NASCET criteria, using the distal internal carotid diameter as the denominator. CONTRAST:  OMNIPAQUE IOHEXOL 350 MG/ML SOLN COMPARISON:  MRI/MRA and CT from 05/12/2017 FINDINGS: CT HEAD FINDINGS Brain: No evidence of acute infarction, hemorrhage, hydrocephalus, extra-axial collection or mass lesion/mass effect. 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 Vascular: Evaluated on CTA portion. Skull: Normal. Negative for fracture or focal lesion. Sinuses: Imaged portions are clear. Orbits: No acute finding. Review of the MIP images confirms the above findings CTA NECK FINDINGS Aortic arch: Imaged portion shows no evidence of aneurysm or dissection. No significant stenosis of the major arch vessel origins. Right carotid system: No evidence of dissection, stenosis (50% or greater) or occlusion. Left carotid system: No evidence of dissection, stenosis (50% or greater) or occlusion. Vertebral arteries: Mildly right dominant. No evidence of dissection, stenosis (50% or greater) or occlusion. Skeleton: No acute abnormality. Other neck: No acute abnormality. Upper chest: No consolidation or pneumothorax. Mild mosaic attenuation. Review of the MIP images confirms the above findings CTA HEAD FINDINGS Anterior circulation: No significant stenosis, proximal occlusion, aneurysm, or vascular malformation. Diminutive right anterior temporal MCA branch; however, this appears similar to prior MRA without definite focal stenosis. Posterior circulation: No significant stenosis, proximal occlusion, aneurysm, or vascular malformation. Venous sinuses: As permitted by contrast timing, patent. Review of the MIP images confirms the above findings IMPRESSION: 1. No acute intracranial abnormality.  No acute  hemorrhage. 2. No substantial (greater than 50%) proximal stenosis or occlusion in the head or neck. Code stroke imaging results were communicated on 06/30/2020 at 12:21 pm to provider Dr. Dalene Seltzer Via telephone, who verbally acknowledged these results. Electronically Signed   By: Feliberto Harts MD   On: 06/30/2020 12:28   CT ANGIO NECK CODE STROKE  Result Date: 06/30/2020 CLINICAL DATA:  Stroke suspected.  Weakness/numbness on left. EXAM: CT ANGIOGRAPHY HEAD AND NECK TECHNIQUE: Multidetector CT imaging of the head and neck was performed using the standard protocol during bolus administration of intravenous contrast. Multiplanar CT image reconstructions and MIPs were obtained to evaluate the vascular anatomy. Carotid stenosis measurements (when applicable) are obtained utilizing NASCET criteria, using the distal internal carotid diameter as the denominator. CONTRAST:  OMNIPAQUE IOHEXOL 350 MG/ML SOLN COMPARISON:  MRI/MRA and CT from 05/12/2017 FINDINGS: CT HEAD FINDINGS Brain: No evidence of acute infarction, hemorrhage, hydrocephalus, extra-axial collection or mass lesion/mass effect. 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 Vascular: Evaluated on CTA portion. Skull: Normal. Negative for fracture or focal lesion. Sinuses: Imaged portions are clear. Orbits: No acute finding. Review of the MIP images confirms the above findings CTA NECK FINDINGS Aortic arch: Imaged portion shows  no evidence of aneurysm or dissection. No significant stenosis of the major arch vessel origins. Right carotid system: No evidence of dissection, stenosis (50% or greater) or occlusion. Left carotid system: No evidence of dissection, stenosis (50% or greater) or occlusion. Vertebral arteries: Mildly right dominant. No evidence of dissection, stenosis (50% or greater) or occlusion. Skeleton: No acute abnormality. Other neck: No acute abnormality. Upper chest:  No consolidation or pneumothorax. Mild mosaic attenuation. Review of the MIP images confirms the above findings CTA HEAD FINDINGS Anterior circulation: No significant stenosis, proximal occlusion, aneurysm, or vascular malformation. Diminutive right anterior temporal MCA branch; however, this appears similar to prior MRA without definite focal stenosis. Posterior circulation: No significant stenosis, proximal occlusion, aneurysm, or vascular malformation. Venous sinuses: As permitted by contrast timing, patent. Review of the MIP images confirms the above findings IMPRESSION: 1. No acute intracranial abnormality.  No acute hemorrhage. 2. No substantial (greater than 50%) proximal stenosis or occlusion in the head or neck. Code stroke imaging results were communicated on 06/30/2020 at 12:21 pm to provider Dr. Dalene Seltzer Via telephone, who verbally acknowledged these results. Electronically Signed   By: Feliberto Harts MD   On: 06/30/2020 12:28    EKG: Independently reviewed. Normal sinus rhythm.   Assessment/Plan Principal Problem:   Paresthesia Active Problems:   HTN (hypertension)   Headache   Paresthesia  -Concern for TIA vs CVA vs complex migraine  -CTA head/neck negative -Check MRI brain. If positive for stroke, will need formal neurology consult  -PT OT SLP  -Aspirin   HTN -Current BP 107/73  -Will hold norvasc, coreg until MRI results to allow permissive hypertension during stroke work up    DVT prophylaxis: Lovenox   Code Status: Full  Family Communication: None at bedside Disposition Plan: Pending MRI, suspect dc home tomorrow if negative  Consults called: Tele neurology     Status is: Observation  The patient remains OBS appropriate and will d/c before 2 midnights.  Dispo: The patient is from: Home              Anticipated d/c is to: Home              Anticipated d/c date is: 1 day              Patient currently is not medically stable to d/c. Stroke work up in  process.     Noralee Stain, DO [facility] 06/30/2020, 4:56 PM   Available via Epic secure chat 7am-7pm After these hours, please refer to coverage provider listed on [email or web address]

## 2020-06-30 NOTE — ED Notes (Signed)
[facility] notified (Kimberly) - patient ready for transport 

## 2020-06-30 NOTE — ED Notes (Signed)
Patient transported to CT 

## 2020-07-01 DIAGNOSIS — R202 Paresthesia of skin: Secondary | ICD-10-CM

## 2020-07-01 LAB — BASIC METABOLIC PANEL
Anion gap: 8 (ref 5–15)
BUN: 6 mg/dL (ref 6–20)
CO2: 21 mmol/L — ABNORMAL LOW (ref 22–32)
Calcium: 9.2 mg/dL (ref 8.9–10.3)
Chloride: 109 mmol/L (ref 98–111)
Creatinine, Ser: 0.77 mg/dL (ref 0.44–1.00)
GFR calc Af Amer: >60 mL/min (ref >60)
GFR calc non Af Amer: >60 mL/min (ref >60)
Glucose, Bld: 85 mg/dL (ref 70–99)
Potassium: 3.9 mmol/L (ref 3.5–5.1)
Sodium: 138 mmol/L (ref 135–145)

## 2020-07-01 LAB — LIPID PANEL
Cholesterol: 195 mg/dL (ref 0–200)
HDL: 37 mg/dL — ABNORMAL LOW (ref >40)
LDL Cholesterol: 139 mg/dL — ABNORMAL HIGH (ref 0–99)
Total CHOL/HDL Ratio: 5.3 RATIO
Triglycerides: 96 mg/dL (ref <150)
VLDL: 19 mg/dL (ref 0–40)

## 2020-07-01 LAB — PHOSPHORUS: Phosphorus: 2.6 mg/dL (ref 2.5–4.6)

## 2020-07-01 LAB — HIV ANTIBODY (ROUTINE TESTING W REFLEX): HIV Screen 4th Generation wRfx: NONREACTIVE

## 2020-07-01 LAB — HEMOGLOBIN A1C
Hgb A1c MFr Bld: 5 % (ref 4.8–5.6)
Mean Plasma Glucose: 96.8 mg/dL

## 2020-07-01 LAB — MAGNESIUM: Magnesium: 2 mg/dL (ref 1.7–2.4)

## 2020-07-01 NOTE — Discharge Summary (Signed)
Physician Discharge Summary  Bianca Villa GBT:[id number] DOB: 30-Jan-1976 DOA: 06/30/2020  PCP: [name], PA  Admit date: 06/30/2020 Discharge date: 07/01/2020  Recommendations for Outpatient Follow-up:  1. Discharge to home 2. Follow up with PCP in 7-10 days. 3. Outpatient PT  Discharge Diagnoses: Principal diagnosis is #1 1. Complex migraine 2. Acute stroke ruled out 3. Ambulatory dysfunction  Discharge Condition: Fair  Disposition: Discharge to home with outpatient PT  Diet recommendation: Heart healthy  Filed Weights   06/30/20 1021 06/30/20 1619  Weight: 79.4 kg 79.4 kg   History of present illness:  Bianca Villa is a 44 y.o. female with medical history significant of HTN who presented to the ED with chief complaint of numbness and headache. She states that she woke up around 1am with a frontal headache without visual changes, light or sound sensitivity. She went back to sleep, woke up at 3am to get ready for work. When she got to work, she started to feel left facial numbness, left arm and left leg numbness as well as weakness.  She states that in 2018 when she was diagnosed with stroke, it was similar in nature with left upper extremity numbness and weakness.  In review of her hospitalization in June 2018, at that time CT head, MRI/MRA were negative.  Neurology at that time felt this was suspected somatization and recommended stress recommendations/management.  ED Course: Due to concern for stroke, she underwent CTA head and neck which were negative for acute intracranial abnormality, substantial proximal stenosis or occlusion. Tele Neurology was consulted who recommended admission for MRI and stroke work up.   Hospital Course:  The patient was transferred to [facility] for further evaluation and treatment. Neurology was consulted. MRI brain was negative for acute pathology. Neurology has evaluated the patient. They feel that the patient likely has a complex migraine.  Chemistry has been rechecked with phosphorus and magnesium. It is normal. PT has cleared the patient for discharge to home with outpatient PT. The patient is discharged to home. Outpatient PT is ordered.  Today's assessment: S: The patient is resting comfortably. She denies any further headache or paresthesias. O: Vitals:  Vitals:   07/01/20 0945 07/01/20 1155  BP:  104/65  Pulse:  63  Resp:  20  Temp:  97.8 F (36.6 C)  SpO2: 100% 100%   Exam:  Constitutional:   The patient is awake, alert, and oriented x 3. No acute distress. Respiratory:   No increased work of breathing.  No wheezes, rales, or rhonchi  No tactile fremitus Cardiovascular:   Regular rate and rhythm  No murmurs, ectopy, or gallups.  No lateral PMI. No thrills. Abdomen:   Abdomen is soft, non-tender, non-distended  No hernias, masses, or organomegaly  Normoactive bowel sounds.  Musculoskeletal:   No cyanosis, clubbing, or edema Skin:   No rashes, lesions, ulcers  palpation of skin: no induration or nodules Neurologic:   CN 2-12 intact  Sensation all 4 extremities intact Psychiatric:   Mental status o Mood, affect appropriate o Orientation to person, place, time   judgment and insight appear intact   Discharge Instructions  Discharge Instructions    Activity as tolerated - No restrictions   Complete by: As directed    Call MD for:   Complete by: As directed    Neurological changes.   Call MD for:  severe uncontrolled pain   Complete by: As directed    Diet - low sodium heart healthy   Complete by: As  directed    Discharge instructions   Complete by: As directed    Discharge to home. Follow up with PCP in 7-10 days.   Increase activity slowly   Complete by: As directed      Allergies as of 07/01/2020      Reactions   Atenolol Other (See Comments)   unknown   Losartan Cough   Other Rash   Potassium-containing Compounds Rash   Sulfa Antibiotics Rash   Sulfasalazine  Rash      Medication List    TAKE these medications   amLODipine 10 MG tablet Commonly known as: NORVASC Take 10 mg by mouth at bedtime.   carvedilol 3.125 MG tablet Commonly known as: COREG Take 3.125 mg by mouth 2 (two) times daily.   Vitamin D (Ergocalciferol) 1.25 MG (50000 UNIT) Caps capsule Commonly known as: DRISDOL Take 50,000 Units by mouth once a week.      Allergies  Allergen Reactions   Atenolol Other (See Comments)    unknown   Losartan Cough   Other Rash   Potassium-Containing Compounds Rash   Sulfa Antibiotics Rash   Sulfasalazine Rash    The results of significant diagnostics from this hospitalization (including imaging, microbiology, ancillary and laboratory) are listed below for reference.    Significant Diagnostic Studies: MR BRAIN WO CONTRAST  Result Date: 06/30/2020 CLINICAL DATA:  Acute neuro deficit.  Rule out stroke EXAM: MRI HEAD WITHOUT CONTRAST TECHNIQUE: Multiplanar, multiecho pulse sequences of the brain and surrounding structures were obtained without intravenous contrast. COMPARISON:  CT angio head and neck 06/30/2020 FINDINGS: Brain: No acute infarction, hemorrhage, hydrocephalus, extra-axial collection or mass lesion. Cerebellar tonsils approximately 5 mm below the foramen magnum compatible with tonsillar ectopia. No impaction or syrinx in the cervical cord. Normal white matter. No significant chronic ischemia or demyelinating disease. Vascular: Normal arterial flow voids. Skull and upper cervical spine: No focal skeletal lesion. Sinuses/Orbits: Mild mucosal edema paranasal sinuses. Negative orbit Other: None IMPRESSION: No acute abnormality Low lying cerebellar tonsils unchanged from prior studies compatible with tonsillar ectopia. Electronically Signed   By: Marlan Palau M.D.   On: 06/30/2020 19:17   CT ANGIO HEAD CODE STROKE  Result Date: 06/30/2020 CLINICAL DATA:  Stroke suspected.  Weakness/numbness on left. EXAM: CT ANGIOGRAPHY  HEAD AND NECK TECHNIQUE: Multidetector CT imaging of the head and neck was performed using the standard protocol during bolus administration of intravenous contrast. Multiplanar CT image reconstructions and MIPs were obtained to evaluate the vascular anatomy. Carotid stenosis measurements (when applicable) are obtained utilizing NASCET criteria, using the distal internal carotid diameter as the denominator. CONTRAST:  OMNIPAQUE IOHEXOL 350 MG/ML SOLN COMPARISON:  MRI/MRA and CT from 05/12/2017 FINDINGS: CT HEAD FINDINGS Brain: No evidence of acute infarction, hemorrhage, hydrocephalus, extra-axial collection or mass lesion/mass effect. 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 Vascular: Evaluated on CTA portion. Skull: Normal. Negative for fracture or focal lesion. Sinuses: Imaged portions are clear. Orbits: No acute finding. Review of the MIP images confirms the above findings CTA NECK FINDINGS Aortic arch: Imaged portion shows no evidence of aneurysm or dissection. No significant stenosis of the major arch vessel origins. Right carotid system: No evidence of dissection, stenosis (50% or greater) or occlusion. Left carotid system: No evidence of dissection, stenosis (50% or greater) or occlusion. Vertebral arteries: Mildly right dominant. No evidence of dissection, stenosis (50% or greater) or occlusion. Skeleton: No acute abnormality. Other neck: No acute  abnormality. Upper chest: No consolidation or pneumothorax. Mild mosaic attenuation. Review of the MIP images confirms the above findings CTA HEAD FINDINGS Anterior circulation: No significant stenosis, proximal occlusion, aneurysm, or vascular malformation. Diminutive right anterior temporal MCA branch; however, this appears similar to prior MRA without definite focal stenosis. Posterior circulation: No significant stenosis, proximal occlusion, aneurysm, or vascular malformation. Venous  sinuses: As permitted by contrast timing, patent. Review of the MIP images confirms the above findings IMPRESSION: 1. No acute intracranial abnormality.  No acute hemorrhage. 2. No substantial (greater than 50%) proximal stenosis or occlusion in the head or neck. Code stroke imaging results were communicated on 06/30/2020 at 12:21 pm to provider Dr. Dalene SeltzerSchlossman Via telephone, who verbally acknowledged these results. Electronically Signed   By: Feliberto HartsFrederick S Jones MD   On: 06/30/2020 12:28   CT ANGIO NECK CODE STROKE  Result Date: 06/30/2020 CLINICAL DATA:  Stroke suspected.  Weakness/numbness on left. EXAM: CT ANGIOGRAPHY HEAD AND NECK TECHNIQUE: Multidetector CT imaging of the head and neck was performed using the standard protocol during bolus administration of intravenous contrast. Multiplanar CT image reconstructions and MIPs were obtained to evaluate the vascular anatomy. Carotid stenosis measurements (when applicable) are obtained utilizing NASCET criteria, using the distal internal carotid diameter as the denominator. CONTRAST:  100mL OMNIPAQUE IOHEXOL 350 MG/ML SOLN COMPARISON:  MRI/MRA and CT from 05/12/2017 FINDINGS: CT HEAD FINDINGS Brain: No evidence of acute infarction, hemorrhage, hydrocephalus, extra-axial collection or mass lesion/mass effect. 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 Vascular: Evaluated on CTA portion. Skull: Normal. Negative for fracture or focal lesion. Sinuses: Imaged portions are clear. Orbits: No acute finding. Review of the MIP images confirms the above findings CTA NECK FINDINGS Aortic arch: Imaged portion shows no evidence of aneurysm or dissection. No significant stenosis of the major arch vessel origins. Right carotid system: No evidence of dissection, stenosis (50% or greater) or occlusion. Left carotid system: No evidence of dissection, stenosis (50% or greater) or occlusion. Vertebral arteries: Mildly  right dominant. No evidence of dissection, stenosis (50% or greater) or occlusion. Skeleton: No acute abnormality. Other neck: No acute abnormality. Upper chest: No consolidation or pneumothorax. Mild mosaic attenuation. Review of the MIP images confirms the above findings CTA HEAD FINDINGS Anterior circulation: No significant stenosis, proximal occlusion, aneurysm, or vascular malformation. Diminutive right anterior temporal MCA branch; however, this appears similar to prior MRA without definite focal stenosis. Posterior circulation: No significant stenosis, proximal occlusion, aneurysm, or vascular malformation. Venous sinuses: As permitted by contrast timing, patent. Review of the MIP images confirms the above findings IMPRESSION: 1. No acute intracranial abnormality.  No acute hemorrhage. 2. No substantial (greater than 50%) proximal stenosis or occlusion in the head or neck. Code stroke imaging results were communicated on 06/30/2020 at 12:21 pm to provider Dr. Dalene SeltzerSchlossman Via telephone, who verbally acknowledged these results. Electronically Signed   By: Feliberto HartsFrederick S Jones MD   On: 06/30/2020 12:28    Microbiology: Recent Results (from the past 240 hour(s))  SARS Coronavirus 2 by RT PCR (hospital order, [facility]) Nasopharyngeal Nasopharyngeal Swab     Status: None   Collection Time: 06/30/20  1:29 PM   Specimen: Nasopharyngeal Swab  Result Value Ref Range Status   SARS Coronavirus 2 NEGATIVE NEGATIVE Final    Comment: (NOTE) SARS-CoV-2 target nucleic acids are NOT DETECTED.  The SARS-CoV-2 RNA is generally detectable in upper and lower respiratory specimens during the  acute phase of infection. The lowest concentration of SARS-CoV-2 viral copies this assay can detect is 250 copies / mL. A negative result does not preclude SARS-CoV-2 infection and should not be used as the sole basis for treatment or other patient management decisions.  A negative result may occur  with improper specimen collection / handling, submission of specimen other than nasopharyngeal swab, presence of viral mutation(s) within the areas targeted by this assay, and inadequate number of viral copies (<250 copies / mL). A negative result must be combined with clinical observations, patient history, and epidemiological information.  Fact Sheet for Patients:   BoilerBrush.com.cy  Fact Sheet for Healthcare Providers: https://pope.com/  This test is not yet approved or  cleared by the [place] FDA and has been authorized for detection and/or diagnosis of SARS-CoV-2 by FDA under an Emergency Use Authorization (EUA).  This EUA will remain in effect (meaning this test can be used) for the duration of the COVID-19 declaration under Section 564(b)(1) of the Act, 21 U.S.C. section 360bbb-3(b)(1), unless the authorization is terminated or revoked sooner.  Performed at [facility], [address] [place], [place] [address]      Labs: Basic Metabolic Panel: Recent Labs  Lab 06/30/20 1142 07/01/20 1125  NA 136 138  K 3.5 3.9  CL 109 109  CO2 19* 21*  GLUCOSE 89 85  BUN 8 6  CREATININE 0.70 0.77  CALCIUM 8.7* 9.2  MG  --  2.0  PHOS  --  2.6   Liver Function Tests: Recent Labs  Lab 06/30/20 1142  AST 15  ALT 14  ALKPHOS 38  BILITOT 1.0  PROT 7.7  ALBUMIN 4.0   No results for input(s): LIPASE, AMYLASE in the last 168 hours. No results for input(s): AMMONIA in the last 168 hours. CBC: Recent Labs  Lab 06/30/20 1142  WBC 6.1  NEUTROABS 2.7  HGB 12.4  HCT 37.4  MCV 97.1  PLT 375   Cardiac Enzymes: No results for input(s): CKTOTAL, CKMB, CKMBINDEX, TROPONINI in the last 168 hours. BNP: BNP (last 3 results) No results for input(s): BNP in the last 8760 hours.  ProBNP (last 3 results) No results for input(s): PROBNP in the last 8760 hours.  CBG: Recent Labs  Lab 06/30/20 1054  GLUCAP 77     Principal Problem:   Paresthesia Active Problems:   HTN (hypertension)   Headache   Time coordinating discharge: 38 minutes  Signed:        Stace Peace, DO [facility]  07/01/2020, 2:06 PM

## 2020-07-01 NOTE — Progress Notes (Signed)
Pt's IV removed; site is clean, dry and intact. Discharge instructions reviewed with pt and spouse; voiced understanding. Personal belongings were packed by pt's spouse and transported with pt. Pt transported via wheelchair to private vehicle driven by spouse.

## 2020-07-01 NOTE — Evaluation (Signed)
Physical Therapy Evaluation Patient Details Name: Bianca Villa MRN: [id number] DOB: 1976-09-02 Today's Date: 07/01/2020   History of Present Illness  44 year old admitted for headache and L side numbness - underwent CTA head and neck which were negative for acute intracranial abnormality, substantial proximal stenosis or occlusion. Tele Neurology was consulted who recommended admission for MRI and stroke work up.    Clinical Impression  Pt reports this same thing happened to her a few years ago and L side never returned as strong as the R.  Now this time the left side weaker than before.  Pt with significant L leg decreased proprioception/sensation knee and distal - she fell to the L with eyes closed as no awareness that L leg was there.  Pt tested 4-/5 entire L leg.  Pt did well walking - focused on education on how to decrease fall risk - have someone with her - standing on L side, have lights on etc.  Pt reports husband can take off from work short term and pt able to go to OPPT.  I feel she will be safe in controlled environment with initial short term assist.   Follow Up Recommendations Outpatient PT;Supervision for mobility/OOB    Equipment Recommendations  None recommended by PT    Recommendations for Other Services       Precautions / Restrictions Precautions Precautions: Fall Restrictions Weight Bearing Restrictions: No Other Position/Activity Restrictions: pt denies dizziness      Mobility  Bed Mobility Overal bed mobility: Independent                Transfers Overall transfer level: Needs assistance   Transfers: Sit to/from Stand Sit to Stand: Supervision         General transfer comment: pt able to do sit to stand without UES - educated on technque - get L leg more active/not use leg on bed to lift and lower  Ambulation/Gait Ambulation/Gait assistance: Min guard Gait Distance (Feet): 100 Feet Assistive device: None Gait Pattern/deviations:  Decreased step length - left;Decreased weight shift to left     General Gait Details: No loss of balance noticed - educated pt on having helper stand on L side for pt safety.  Stairs            Wheelchair Mobility    Modified Rankin (Stroke Patients Only)       Balance                                 Standardized Balance Assessment Standardized Balance Assessment : Berg Balance Test Berg Balance Test Sit to Stand: Able to stand without using hands and stabilize independently Standing Unsupported: Able to stand safely 2 minutes Sitting with Back Unsupported but Feet Supported on Floor or Stool: Able to sit safely and securely 2 minutes Stand to Sit: Sits safely with minimal use of hands Transfers: Able to transfer safely, minor use of hands Standing Unsupported with Eyes Closed: Needs help to keep from falling Standing Ubsupported with Feet Together: Able to place feet together independently and stand for 1 minute with supervision From Standing, Reach Forward with Outstretched Arm: Can reach forward >5 cm safely (2") From Standing Position, Pick up Object from Floor: Able to pick up shoe, needs supervision From Standing Position, Turn to Look Behind Over each Shoulder: Looks behind from both sides and weight shifts well Turn 360 Degrees: Needs close supervision or verbal cueing Standing  Unsupported, Alternately Place Feet on Step/Stool: Able to complete 4 steps without aid or supervision Standing Unsupported, One Foot in Front: Able to take small step independently and hold 30 seconds Standing on One Leg: Tries to lift leg/unable to hold 3 seconds but remains standing independently Total Score: 38         Pertinent Vitals/Pain Pain Assessment: No/denies pain    Home Living Family/patient expects to be discharged to:: Private residence Living Arrangements: Spouse/significant other Available Help at Discharge: Family;Available PRN/intermittently          Home Layout: One level Home Equipment: None      Prior Function Level of Independence: Independent         Comments: 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, drilling        Extremity/Trunk Assessment   Upper Extremity Assessment Upper Extremity Assessment: Defer to OT evaluation    Lower Extremity Assessment Lower Extremity Assessment: LLE deficits/detail LLE Deficits / Details: grossly 4-/5 with delay in activation - equally weak throughout LLE Sensation: decreased light touch;decreased proprioception (pt reports unable to feel her L leg in standing) LLE Coordination: decreased gross motor    Cervical / Trunk Assessment Cervical / Trunk Assessment: Normal  Communication   Communication: No difficulties  Cognition Arousal/Alertness: Awake/alert Behavior During Therapy: WFL for tasks assessed/performed Overall Cognitive Status: Within Functional Limits for tasks assessed                                        General Comments General comments (skin integrity, edema, etc.): pt very tentative with any balance on L leg - poor propriception and awareness of L leg - gave instruction on fall prevention and ways to decrease fall risk    Exercises Total Joint Exercises Long Arc Quad: Left;10 reps   Assessment/Plan    PT Assessment Patient needs continued PT services  PT Problem List Decreased strength;Decreased mobility;Decreased safety awareness;Decreased coordination;Decreased activity tolerance;Decreased balance;Impaired sensation       PT Treatment Interventions Therapeutic activities;Gait training;Therapeutic exercise;Patient/family education;Balance training;Functional mobility training    PT Goals (Current goals can be found in the Care Plan section)  Acute Rehab PT Goals Patient Stated Goal: To make sure this doesnt happen again PT Goal Formulation: With patient Time For Goal Achievement: 07/14/20 Potential to Achieve  Goals: Good    Frequency Min 3X/week   Barriers to discharge   Pt reports husband can take off short term to be with her initially    Co-evaluation               AM-PAC PT "6 Clicks" Mobility  Outcome Measure Help needed turning from your back to your side while in a flat bed without using bedrails?: None Help needed moving from lying on your back to sitting on the side of a flat bed without using bedrails?: None Help needed moving to and from a bed to a chair (including a wheelchair)?: A Little Help needed standing up from a chair using your arms (e.g., wheelchair or bedside chair)?: A Little Help needed to walk in hospital room?: A Little Help needed climbing 3-5 steps with a railing? : A Little 6 Click Score: 20    End of Session Equipment Utilized During Treatment: Gait belt Activity Tolerance: Patient tolerated treatment well Patient left: in chair;with call bell/phone within reach Nurse Communication: Mobility  status;Precautions PT Visit Diagnosis: Unsteadiness on feet (R26.81);Muscle weakness (generalized) (M62.81);Other symptoms and signs involving the nervous system (R29.898);Hemiplegia and hemiparesis Hemiplegia - Right/Left: Left Hemiplegia - dominant/non-dominant: Non-dominant Hemiplegia - caused by: Unspecified    Time: 8366-2947 PT Time Calculation (min) (ACUTE ONLY): 40 min   Charges:   PT Evaluation $PT Eval Low Complexity: 1 Low PT Treatments $Gait Training: 8-22 mins $Therapeutic Exercise: 8-22 mins        07/01/2020   Ranae Palms, PT   Judson Roch 07/01/2020, 9:54 AM

## 2020-07-01 NOTE — Consult Note (Addendum)
Neurology Consultation  Reason for Consult: headache and paresthesia Referring Physician: Dr. Gerri Lins   CC: headache and left side numbness to face, arm and leg   History is obtained from:patient and medical record   HPI: Tamiah Dysart is a 44 y.o. female with a past medical history of HTN and DDD (thoracolumbar), who presented to the ED with c/o headache and numbness to the left side of her body.  She states she woke up around 1am with a frontal headache, went back to sleep until 3 am when she got up to go to work.  She states when she got to work is when she noticed the numbness and then noted left side weakness and "felt off".  She also informs me that she had a similar episode in 2018 which she was diagnosed with TIA, however she attended PT for ~ a year for the left side weakness which she says her full strength never returned.  Imaging at this time was negative and neurology felt it was somatization and recommended stress management.   Patient tells me she feels better, no headache or numbness but feels something is not right, denies any stress.  Patient denies hx of headaches or migraines, slurred speech, vision abnormalities, CP, cough, N/V/D, fevers.    06/30/2020- CT/CTA head no acute abnormality. MRI Head negative for acute abnormality. Neurology consulted.    ROS: A 14 point ROS was performed and is negative except as noted in the HPI.   Past Medical History:  Diagnosis Date  . Hypertension    1-No significant post stroke disability and can perform usual duties with stroke symptoms Essential (primary) hypertension   Family History  Problem Relation Age of Onset  . Diabetes Sister     Social History:   reports that she has never smoked. She has never used smokeless tobacco. She reports that she does not drink alcohol and does not use drugs.   Medications  Current Facility-Administered Medications:  .  0.9 %  sodium chloride infusion, 250 mL, Intravenous, PRN, Noralee Stain, DO .  acetaminophen (TYLENOL) tablet 650 mg, 650 mg, Oral, Q6H PRN **OR** acetaminophen (TYLENOL) suppository 650 mg, 650 mg, Rectal, Q6H PRN, Noralee Stain, DO .  aspirin EC tablet 81 mg, 81 mg, Oral, Daily, Noralee Stain, DO, 81 mg at 06/30/20 1828 .  enoxaparin (LOVENOX) injection 40 mg, 40 mg, Subcutaneous, Q24H, Noralee Stain, DO, 40 mg at 06/30/20 1828 .  ketorolac (TORADOL) 15 MG/ML injection 15 mg, 15 mg, Intravenous, Q6H PRN, Noralee Stain, DO .  ondansetron (ZOFRAN) tablet 4 mg, 4 mg, Oral, Q6H PRN **OR** ondansetron (ZOFRAN) injection 4 mg, 4 mg, Intravenous, Q6H PRN, Noralee Stain, DO .  sodium chloride flush (NS) 0.9 % injection 3 mL, 3 mL, Intravenous, Q12H, Choi, Jennifer, DO .  sodium chloride flush (NS) 0.9 % injection 3 mL, 3 mL, Intravenous, Q12H, Noralee Stain, DO, 3 mL at 06/30/20 2156 .  sodium chloride flush (NS) 0.9 % injection 3 mL, 3 mL, Intravenous, PRN, Noralee Stain, DO   Exam: Current vital signs: BP (!) 107/57 (BP Location: Right Arm)   Pulse 75   Temp 98.2 F (36.8 C) (Oral)   Resp 16   Ht 5\' 2"  (1.575 m)   Wt 79.4 kg   LMP 06/23/2020   SpO2 100%   BMI 32.02 kg/m  Vital signs in last 24 hours: Temp:  [97.7 F (36.5 C)-99 F (37.2 C)] 98.2 F (36.8 C) (08/28 0805) Pulse Rate:  [63-87] 75 (  08/28 0805) Resp:  [13-21] 16 (08/28 0805) BP: (98-128)/(57-87) 107/57 (08/28 0805) SpO2:  [98 %-100 %] 100 % (08/28 0805) Weight:  [79.4 kg] 79.4 kg (08/27 1619)  GENERAL: Awake, alert in NAD HENT: - Normocephalic and atraumatic, dry mm, no Thyromegally Eyes: normal conjunctiva  LUNGS - Clear to auscultation bilaterally with no wheezes CV - S1S2 RRR, no m/r/g, equal pulses bilaterally. ABDOMEN - Soft, nontender, nondistended with normoactive BS Ext: warm, well perfused, intact peripheral pulses, no edema Psych: normal mood and affect   NEURO:  Mental Status: AA&Ox3, able to tell coherent history  Language: speech is clear.  Naming,  repetition, fluency, and comprehension intact. Cranial Nerves: PERRL 70mm/brisk. EOMI, visual fields full, no facial asymmetry, decreased left facial sensation, hearing intact, tongue/uvula/soft palate midline, normal sternocleidomastoid and trapezius muscle strength. No evidence of tongue atrophy or fibrillations Motor: 5/5 in all 4 extremities, slight weakness noted on left arm and left leg  Tone: is normal and bulk is normal Sensation- decreased on left to light touch bilaterally Coordination: FTN intact bilaterally, no ataxia in BLE. Gait- deferred  NIHSS  1a Level of Conscious.: 0 1b LOC Questions: 0 1c LOC Commands: 0 2 Best Gaze: 0 3 Visual: 0 4 Facial Palsy: 0 5a Motor Arm - left: 1 5b Motor Arm - Right: 0 6a Motor Leg - Left: 1 6b Motor Leg - Right: 0 7 Limb Ataxia: 0 8 Sensory: 1 9 Best Language: 0 10 Dysarthria: 0 11 Extinct. and Inatten.: 0 TOTAL: 3   Labs I have reviewed labs in epic and the results pertinent to this consultation are:   CBC    Component Value Date/Time   WBC 6.1 06/30/2020 1142   RBC 3.85 (L) 06/30/2020 1142   HGB 12.4 06/30/2020 1142   HCT 37.4 06/30/2020 1142   PLT 375 06/30/2020 1142   MCV 97.1 06/30/2020 1142   MCH 32.2 06/30/2020 1142   MCHC 33.2 06/30/2020 1142   RDW 12.1 06/30/2020 1142   LYMPHSABS 2.5 06/30/2020 1142   MONOABS 0.6 06/30/2020 1142   EOSABS 0.2 06/30/2020 1142   BASOSABS 0.0 06/30/2020 1142    CMP     Component Value Date/Time   NA 136 06/30/2020 1142   K 3.5 06/30/2020 1142   CL 109 06/30/2020 1142   CO2 19 (L) 06/30/2020 1142   GLUCOSE 89 06/30/2020 1142   BUN 8 06/30/2020 1142   CREATININE 0.70 06/30/2020 1142   CALCIUM 8.7 (L) 06/30/2020 1142   PROT 7.7 06/30/2020 1142   ALBUMIN 4.0 06/30/2020 1142   AST 15 06/30/2020 1142   ALT 14 06/30/2020 1142   ALKPHOS 38 06/30/2020 1142   BILITOT 1.0 06/30/2020 1142   GFRNONAA >60 06/30/2020 1142   GFRAA >60 06/30/2020 1142    Lipid Panel      Component Value Date/Time   CHOL 195 07/01/2020 0148   TRIG 96 07/01/2020 0148   HDL 37 (L) 07/01/2020 0148   CHOLHDL 5.3 07/01/2020 0148   VLDL 19 07/01/2020 0148   LDLCALC 139 (H) 07/01/2020 0148     Imaging I have reviewed the images obtained:  CT-scan of the brain no acute abnormality CTA head no acute abnormality   MRI examination of the brain no acute abnormality   Assessment:   Maitlyn Penza is a 44 y.o. female with a past medical history of HTN and DDD (thoracolumbar), who presented to the ED with c/o headache and numbness to the left side of her body.  She  states she woke up around 1am with a frontal headache, went back to sleep until 3 am when she got up to go to work.  She states when she got to work is when she noticed the numbness and then noted left side weakness and "felt off". Currently no headache and no complaints -- MRI brain shows no acute abnormality. 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 This finding can predispose to headaches.  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 Acute stroke has been ruled out with MRI .  Recommendations: Manage headache  Check BMP, Mg and  Phos in am  No further neuro recommendations  --Gevena Mart, DNP   The patient was discharged home before Neurology attending was able to see. No charge.  Electronically signed: Dr. Caryl Pina

## 2020-07-01 NOTE — Evaluation (Signed)
Occupational Therapy Evaluation Patient Details Name: Bianca Villa MRN: [id number] DOB: 12-15-1975 Today's Date: 07/01/2020    History of Present Illness 44 year old admitted for headache and L side numbness - underwent CTA head and neck which were negative for acute intracranial abnormality, substantial proximal stenosis or occlusion. Tele Neurology was consulted who recommended admission for MRI and stroke work up.   Clinical Impression   Pt admitted with the above diagnoses and presents with below problem list. Pt will benefit from continued acute OT to address the below listed deficits and maximize independence with basic ADLs prior to d/c home. At baseline, pt is independent with ADLs, works, drives. Pt is currently setup to min guard with ADLs. Presents with some LUE weakness and decreased proprioception.     Follow Up Recommendations  Outpatient OT    Equipment Recommendations  None recommended by OT    Recommendations for Other Services       Precautions / Restrictions Precautions Precautions: Fall Precaution Comments: decreased sensation LUE Restrictions Weight Bearing Restrictions: No      Mobility Bed Mobility Overal bed mobility: Independent                Transfers Overall transfer level: Needs assistance Equipment used: None Transfers: Sit to/from Stand Sit to Stand: Supervision              Balance                                           ADL either performed or assessed with clinical judgement   ADL Overall ADL's : Needs assistance/impaired Eating/Feeding: Set up;Sitting   Grooming: Set up;Sitting;Standing;Min guard   Upper Body Bathing: Set up;Sitting   Lower Body Bathing: Min guard;Sit to/from stand   Upper Body Dressing : Set up;Sitting   Lower Body Dressing: Min guard;Sit to/from stand   Toilet Transfer: Min guard;Ambulation   Toileting- Clothing Manipulation and Hygiene: Min guard;Set  up;Sitting/lateral lean;Sit to/from stand   Tub/ Shower Transfer: Min guard   Functional mobility during ADLs: Min guard       Vision Patient Visual Report: No change from baseline       Perception     Praxis      Pertinent Vitals/Pain Pain Assessment: No/denies pain     Hand Dominance Right   Extremity/Trunk Assessment Upper Extremity Assessment Upper Extremity Assessment: LUE deficits/detail LUE Deficits / Details: grossly 4/5 strength, mildly decreased proprioception LUE Sensation: decreased light touch;decreased proprioception LUE Coordination: decreased fine motor   Lower Extremity Assessment Lower Extremity Assessment: Defer to PT evaluation   Cervical / Trunk Assessment Cervical / Trunk Assessment: Normal   Communication Communication Communication: No difficulties   Cognition Arousal/Alertness: Awake/alert Behavior During Therapy: WFL for tasks assessed/performed Overall Cognitive Status: Within Functional Limits for tasks assessed                                     General Comments       Exercises Exercises: Other exercises Other Exercises Other Exercises: provided level 1 theraband and hand ball and instructed in use for UB strengthening. ALso provided HEP for [facility] and proprioceptive exercises   Shoulder Instructions      Home Living Family/patient expects to be discharged to:: Private residence Living Arrangements: Spouse/significant other Available Help at  Discharge: Family;Available PRN/intermittently         Home Layout: One level     Bathroom Shower/Tub: Chief Strategy Officer: Standard     Home Equipment: None          Prior Functioning/Environment Level of Independence: Independent        Comments: pt works in [employer] - lots of walking and lifting        OT Problem List: Decreased strength;Impaired balance (sitting and/or standing);Decreased coordination;Decreased knowledge of use of DME or  AE;Decreased knowledge of precautions;Impaired sensation;Impaired UE functional use      OT Treatment/Interventions: Self-care/ADL training;Therapeutic exercise;DME and/or AE instruction;Neuromuscular education;Therapeutic activities;Patient/family education;Balance training    OT Goals(Current goals can be found in the care plan section) Acute Rehab OT Goals Patient Stated Goal: To make sure this doesnt happen again OT Goal Formulation: With patient Time For Goal Achievement: 07/15/20 Potential to Achieve Goals: Good ADL Goals Pt Will Perform Grooming: Independently;sitting;standing Pt Will Perform Tub/Shower Transfer: with modified independence;ambulating Pt/caregiver will Perform Home Exercise Program: Increased strength;Left upper extremity;With written HEP provided;With theraband  OT Frequency: Min 2X/week   Barriers to D/C:            Co-evaluation              AM-PAC OT "6 Clicks" Daily Activity     Outcome Measure Help from another person eating meals?: None Help from another person taking care of personal grooming?: None Help from another person toileting, which includes using toliet, bedpan, or urinal?: None Help from another person bathing (including washing, rinsing, drying)?: A Little Help from another person to put on and taking off regular upper body clothing?: None Help from another person to put on and taking off regular lower body clothing?: None 6 Click Score: 23   End of Session    Activity Tolerance: Patient tolerated treatment well Patient left: in bed;with call bell/phone within reach;with bed alarm set;with family/visitor present  OT Visit Diagnosis: Unsteadiness on feet (R26.81);Hemiplegia and hemiparesis;Other symptoms and signs involving the nervous system (R29.898) Hemiplegia - Right/Left: Left Hemiplegia - dominant/non-dominant: Non-Dominant                Time: 1359-1411 OT Time Calculation (min): 12 min Charges:  OT General Charges $OT  Visit: 1 Visit OT Evaluation $OT Eval Low Complexity: 1 Low  Raynald Kemp, OT Acute Rehabilitation Services Pager: [phone] Office: [phone]   Pilar Grammes 07/01/2020, 2:38 PM

## 2021-11-21 ENCOUNTER — Emergency Department (HOSPITAL_BASED_OUTPATIENT_CLINIC_OR_DEPARTMENT_OTHER)
Admission: EM | Admit: 2021-11-21 | Discharge: 2021-11-21 | Disposition: A | Discharge status: Home / Self Care | Payer: BC Managed Care – PPO | Attending: Emergency Medicine | Admitting: Emergency Medicine

## 2021-11-21 ENCOUNTER — Emergency Department (HOSPITAL_BASED_OUTPATIENT_CLINIC_OR_DEPARTMENT_OTHER): Payer: BC Managed Care – PPO

## 2021-11-21 ENCOUNTER — Other Ambulatory Visit: Payer: Self-pay

## 2021-11-21 ENCOUNTER — Encounter (HOSPITAL_BASED_OUTPATIENT_CLINIC_OR_DEPARTMENT_OTHER): Payer: Self-pay

## 2021-11-21 DIAGNOSIS — M7122 Synovial cyst of popliteal space [Baker], left knee: Secondary | ICD-10-CM | POA: Diagnosis not present

## 2021-11-21 DIAGNOSIS — R072 Precordial pain: Secondary | ICD-10-CM | POA: Diagnosis not present

## 2021-11-21 DIAGNOSIS — R202 Paresthesia of skin: Secondary | ICD-10-CM | POA: Diagnosis not present

## 2021-11-21 DIAGNOSIS — R0789 Other chest pain: Secondary | ICD-10-CM | POA: Diagnosis present

## 2021-11-21 DIAGNOSIS — R600 Localized edema: Secondary | ICD-10-CM | POA: Insufficient documentation

## 2021-11-21 DIAGNOSIS — I10 Essential (primary) hypertension: Secondary | ICD-10-CM | POA: Insufficient documentation

## 2021-11-21 LAB — COMPREHENSIVE METABOLIC PANEL
ALT: 20 U/L (ref 0–44)
AST: 21 U/L (ref 15–41)
Albumin: 4.3 g/dL (ref 3.5–5.0)
Alkaline Phosphatase: 41 U/L (ref 38–126)
Anion gap: 9 (ref 5–15)
BUN: 12 mg/dL (ref 6–20)
CO2: 23 mmol/L (ref 22–32)
Calcium: 9.5 mg/dL (ref 8.9–10.3)
Chloride: 103 mmol/L (ref 98–111)
Creatinine, Ser: 0.84 mg/dL (ref 0.44–1.00)
GFR, Estimated: >60 mL/min (ref >60)
Glucose, Bld: 77 mg/dL (ref 70–99)
Potassium: 3.7 mmol/L (ref 3.5–5.1)
Sodium: 135 mmol/L (ref 135–145)
Total Bilirubin: 0.9 mg/dL (ref 0.3–1.2)
Total Protein: 8.2 g/dL — ABNORMAL HIGH (ref 6.5–8.1)

## 2021-11-21 LAB — TROPONIN I (HIGH SENSITIVITY)
Troponin I (High Sensitivity): 4 ng/L (ref <18)
Troponin I (High Sensitivity): 3 ng/L (ref <18)

## 2021-11-21 LAB — CBC WITH DIFFERENTIAL/PLATELET
Abs Immature Granulocytes: 0.01 10*3/uL (ref 0.00–0.07)
Basophils Absolute: 0.1 10*3/uL (ref 0.0–0.1)
Basophils Relative: 1 %
Eosinophils Absolute: 0.3 10*3/uL (ref 0.0–0.5)
Eosinophils Relative: 5 %
HCT: 39.4 % (ref 36.0–46.0)
Hemoglobin: 13.4 g/dL (ref 12.0–15.0)
Immature Granulocytes: 0 %
Lymphocytes Relative: 39 %
Lymphs Abs: 2.4 10*3/uL (ref 0.7–4.0)
MCH: 32.9 pg (ref 26.0–34.0)
MCHC: 34 g/dL (ref 30.0–36.0)
MCV: 96.8 fL (ref 80.0–100.0)
Monocytes Absolute: 0.5 10*3/uL (ref 0.1–1.0)
Monocytes Relative: 8 %
Neutro Abs: 3 10*3/uL (ref 1.7–7.7)
Neutrophils Relative %: 47 %
Platelets: 393 10*3/uL (ref 150–400)
RBC: 4.07 MIL/uL (ref 3.87–5.11)
RDW: 12.3 % (ref 11.5–15.5)
WBC: 6.3 10*3/uL (ref 4.0–10.5)
nRBC: 0 % (ref 0.0–0.2)

## 2021-11-21 LAB — D-DIMER, QUANTITATIVE: D-Dimer, Quant: 0.37 ug/mL-FEU (ref 0.00–0.50)

## 2021-11-21 LAB — BRAIN NATRIURETIC PEPTIDE: B Natriuretic Peptide: 16.4 pg/mL (ref 0.0–100.0)

## 2021-11-21 MED ORDER — KETOROLAC TROMETHAMINE 30 MG/ML IJ SOLN
30.0000 mg | Freq: Once | INTRAMUSCULAR | Status: AC
  Administered 2021-11-21: 30 mg via INTRAVENOUS
  Filled 2021-11-21: qty 1

## 2021-11-21 NOTE — Discharge Instructions (Addendum)
You were seen in the emerge department today with chest discomfort.  Your labs here are reassuring.  Take Motrin for pain. I am referring you to a cardiologist for additional follow-up.  Please call to confirm your follow-up appointment.  Your left leg shows a cyst behind the knee which may be causing some of your swelling and discomfort.  I have listed the name of a sports medicine doctor for follow-up.  Please call to schedule a follow-up appointment.

## 2021-11-21 NOTE — ED Triage Notes (Signed)
Pt arrives ambulatory to ED with reports of central CP starting today with radiation into back Offered wheelchair but patient did decline, but needed to stop walking half way to room related to pain. States she is also have some numbness to her left finger tips. Some swelling to ankles.

## 2021-11-21 NOTE — ED Provider Notes (Signed)
Emergency Department Provider Note   I have reviewed the triage vital signs and the nursing notes.   HISTORY  Chief Complaint Chest Pain   HPI Bianca Villa is a 46 y.o. female past history of hypertension, complex migraines, TIA presents to the emergency department with chest discomfort.  Patient describes a "punching" type pain in the center of her chest radiating around to the left chest.  Symptoms are worse with deep breathing.  She is not feeling particularly short of breath.  She does have some tingling at the tips of the fingers in the left arm but no numbness or weakness as she has had with prior complex migraines and TIAs.  No fevers.  No upper respiratory infection symptoms.  Denies abdominal pain, vomiting, diarrhea. Patient has noticed some swelling in the ankles and swelling in the left leg.    Past Medical History:  Diagnosis Date   Hypertension     Review of Systems  Constitutional: No fever/chills Eyes: No visual changes. ENT: No sore throat. Cardiovascular: Positive chest pain. Respiratory: Denies shortness of breath. Gastrointestinal: No abdominal pain.  No nausea, no vomiting.  No diarrhea.  No constipation. Genitourinary: Negative for dysuria. Musculoskeletal: Negative for back pain. Left leg swelling.  Skin: Negative for rash. Neurological: Negative for headaches, focal weakness or numbness. Tingling in the left finger tips.    ____________________________________________   PHYSICAL EXAM:  VITAL SIGNS: ED Triage Vitals  Enc Vitals Group     BP 11/21/21 1241 128/81     Pulse Rate 11/21/21 1241 78     Resp 11/21/21 1241 (!) 22     Temp 11/21/21 1241 98.2 F (36.8 C)     Temp Source 11/21/21 1241 Oral     SpO2 11/21/21 1241 100 %     Weight 11/21/21 1242 178 lb (80.7 kg)     Height 11/21/21 1242 5\' 2"  (1.575 m)   Constitutional: Alert and oriented. Well appearing and in no acute distress. Eyes: Conjunctivae are normal.  Head:  Atraumatic. Nose: No congestion/rhinnorhea. Mouth/Throat: Mucous membranes are moist.  Neck: No stridor.   Cardiovascular: Normal rate, regular rhythm. Good peripheral circulation. Grossly normal heart sounds.   Respiratory: Normal respiratory effort.  No retractions. Lungs CTAB. Gastrointestinal: Soft and nontender. No distention.  Musculoskeletal: No gross deformities of extremities. Pitting edema (1+) in the LLE increased compared to the right.  Reproducible chest pain along the sternum.  Neurologic:  Normal speech and language. No gross focal neurologic deficits are appreciated.  Skin:  Skin is warm, dry and intact. No rash noted.   ____________________________________________   LABS (all labs ordered are listed, but only abnormal results are displayed)  Labs Reviewed  COMPREHENSIVE METABOLIC PANEL - Abnormal; Notable for the following components:      Result Value   Total Protein 8.2 (*)    All other components within normal limits  CBC WITH DIFFERENTIAL/PLATELET  D-DIMER, QUANTITATIVE  BRAIN NATRIURETIC PEPTIDE  TROPONIN I (HIGH SENSITIVITY)  TROPONIN I (HIGH SENSITIVITY)   ____________________________________________  EKG   EKG Interpretation  Date/Time:  Wednesday November 21 2021 12:40:19 EST Ventricular Rate:  79 PR Interval:  136 QRS Duration: 85 QT Interval:  372 QTC Calculation: 427 R Axis:   58 Text Interpretation: Sinus rhythm Baseline wander in lead(s) V1 V2 V3 Confirmed by Nanda Quinton [phone]) on 11/21/2021 12:55:27 PM        ____________________________________________  RADIOLOGY  DG Chest 2 View  Result Date: 11/21/2021 CLINICAL DATA:  Chest  pain EXAM: CHEST - 2 VIEW COMPARISON:  None. FINDINGS: The heart size and mediastinal contours are within normal limits. Both lungs are clear. The visualized skeletal structures are unremarkable. IMPRESSION: No active cardiopulmonary disease. Electronically Signed   By: Franchot Gallo M.D.   On: 11/21/2021  13:20   US Venous Img Lower  Left (DVT Study)  Result Date: 11/21/2021 CLINICAL DATA:  Three week history of left lower extremity edema EXAM: LEFT LOWER EXTREMITY VENOUS DOPPLER ULTRASOUND TECHNIQUE: Gray-scale sonography with compression, as well as color and duplex ultrasound, were performed to evaluate the deep venous system(s) from the level of the common femoral vein through the popliteal and proximal calf veins. COMPARISON:  None. FINDINGS: VENOUS Normal compressibility of the common femoral, superficial femoral, and popliteal veins, as well as the visualized calf veins. Visualized portions of profunda femoral vein and great saphenous vein unremarkable. No filling defects to suggest DVT on grayscale or color Doppler imaging. Doppler waveforms show normal direction of venous flow, normal respiratory plasticity and response to augmentation. Limited views of the contralateral common femoral vein are unremarkable. OTHER Mildly complex hypoechoic fluid collection in the medial popliteal fossa measures approximately 2.4 x 0.9 cm. Findings are most consistent with a Baker's cyst. Limitations: none IMPRESSION: 1. No evidence of deep venous thrombosis. 2. Mildly complex Baker's cyst. Electronically Signed   By: Jacqulynn Cadet M.D.   On: 11/21/2021 13:41    ____________________________________________   PROCEDURES  Procedure(s) performed:   Procedures  None ____________________________________________   INITIAL IMPRESSION / ASSESSMENT AND PLAN / ED COURSE  Pertinent labs & imaging results that were available during my care of the patient were reviewed by me and considered in my medical decision making (see chart for details).   This patient is Presenting for Evaluation of chest pain, which does require a range of treatment options, and is a complaint that involves a high risk of morbidity and mortality.  The Differential Diagnoses includes all life-threatening causes for chest pain. This  includes but is not exclusive to acute coronary syndrome, aortic dissection, pulmonary embolism, cardiac tamponade, community-acquired pneumonia, pericarditis, musculoskeletal chest wall pain, etc.   Critical Interventions- rapid evaluation, EKG, and labs.    Reassessment after intervention: Patient looking well with normal vitals and no ectopy on monitor.    I decided to review pertinent External Data, and in summary no recent heart cath or stress test for review.   Clinical Laboratory Tests Ordered, included troponin negative. BNP is WNL. No AKI. D dimer is negative.   Radiologic Tests Ordered, included CXR and DVT US which I independently evaluated and agree with radiology interpretation.   Cardiac Monitor Tracing which shows NSR.   Social Determinants of Health Risk non-smoker.    Medical Decision Making: Summary:  Patient presents to the ED with CP. Labs here reassuring for ACS/PE. Question some component of MSK pain. Patient low risk by heart score. Will place referral for Cardiology evaluation.   Disposition: discharge.   ____________________________________________  FINAL CLINICAL IMPRESSION(S) / ED DIAGNOSES  Final diagnoses:  Precordial chest pain  Synovial cyst of left popliteal space     MEDICATIONS GIVEN DURING THIS VISIT:  Medications  ketorolac (TORADOL) 30 MG/ML injection 30 mg (30 mg Intravenous Given 11/21/21 1509)    Note:  This document was prepared using Dragon voice recognition software and may include unintentional dictation errors.  Nanda Quinton, MD, [facility]    Evonda Enge, [facility], MD 11/28/21 [phone]

## 2021-11-21 NOTE — ED Notes (Signed)
Patient transported to X-ray 

## 2021-11-21 NOTE — ED Provider Notes (Signed)
°  Physical Exam  BP 116/77    Pulse 74    Temp 98.2 F (36.8 C) (Oral)    Resp 18    Ht 5\' 2"  (1.575 m)    Wt 80.7 kg    LMP 11/14/2021    SpO2 100%    BMI 32.56 kg/m   Physical Exam  Procedures  Procedures  ED Course / MDM    Medical Decision Making Care assumed at 3 pm.  Patient is here with chest pain and left leg pain.  DVT study showed Baker's cyst and no DVT.  Patient's first troponin is negative.  Pending second troponin and Toradol  4:05 PM Patient felt better after Toradol and second troponin is negative.  Patient already has Ortho follow-up regarding the Baker's cyst.  I told her to talk to her orthopedic doctor about her Baker's cyst.  I told her that she can follow-up with cardiology if she has persistent chest pain.  Otherwise she can take ibuprofen for pain   Problems Addressed: Precordial chest pain: acute illness or injury Synovial cyst of left popliteal space: acute illness or injury  Amount and/or Complexity of Data Reviewed Labs: ordered. Radiology: ordered and independent interpretation performed. Decision-making details documented in ED Course. ECG/medicine tests: ordered and independent interpretation performed. Decision-making details documented in ED Course.  Risk Prescription drug management.        01/12/2022, MD 11/21/21 [phone]

## 2021-11-21 NOTE — ED Notes (Signed)
Pt attempted to sign for discharge papers, but computer started having issues so she was unable to sign. Pt verbalized understanding of discharge papers.

## 2022-06-30 IMAGING — CR DG CHEST 2V
2 series · 2 of 2 positions shown · non-contrast
Comparison: None.

CLINICAL DATA: Chest pain

EXAM:
CHEST - 2 VIEW

[w chest pa]
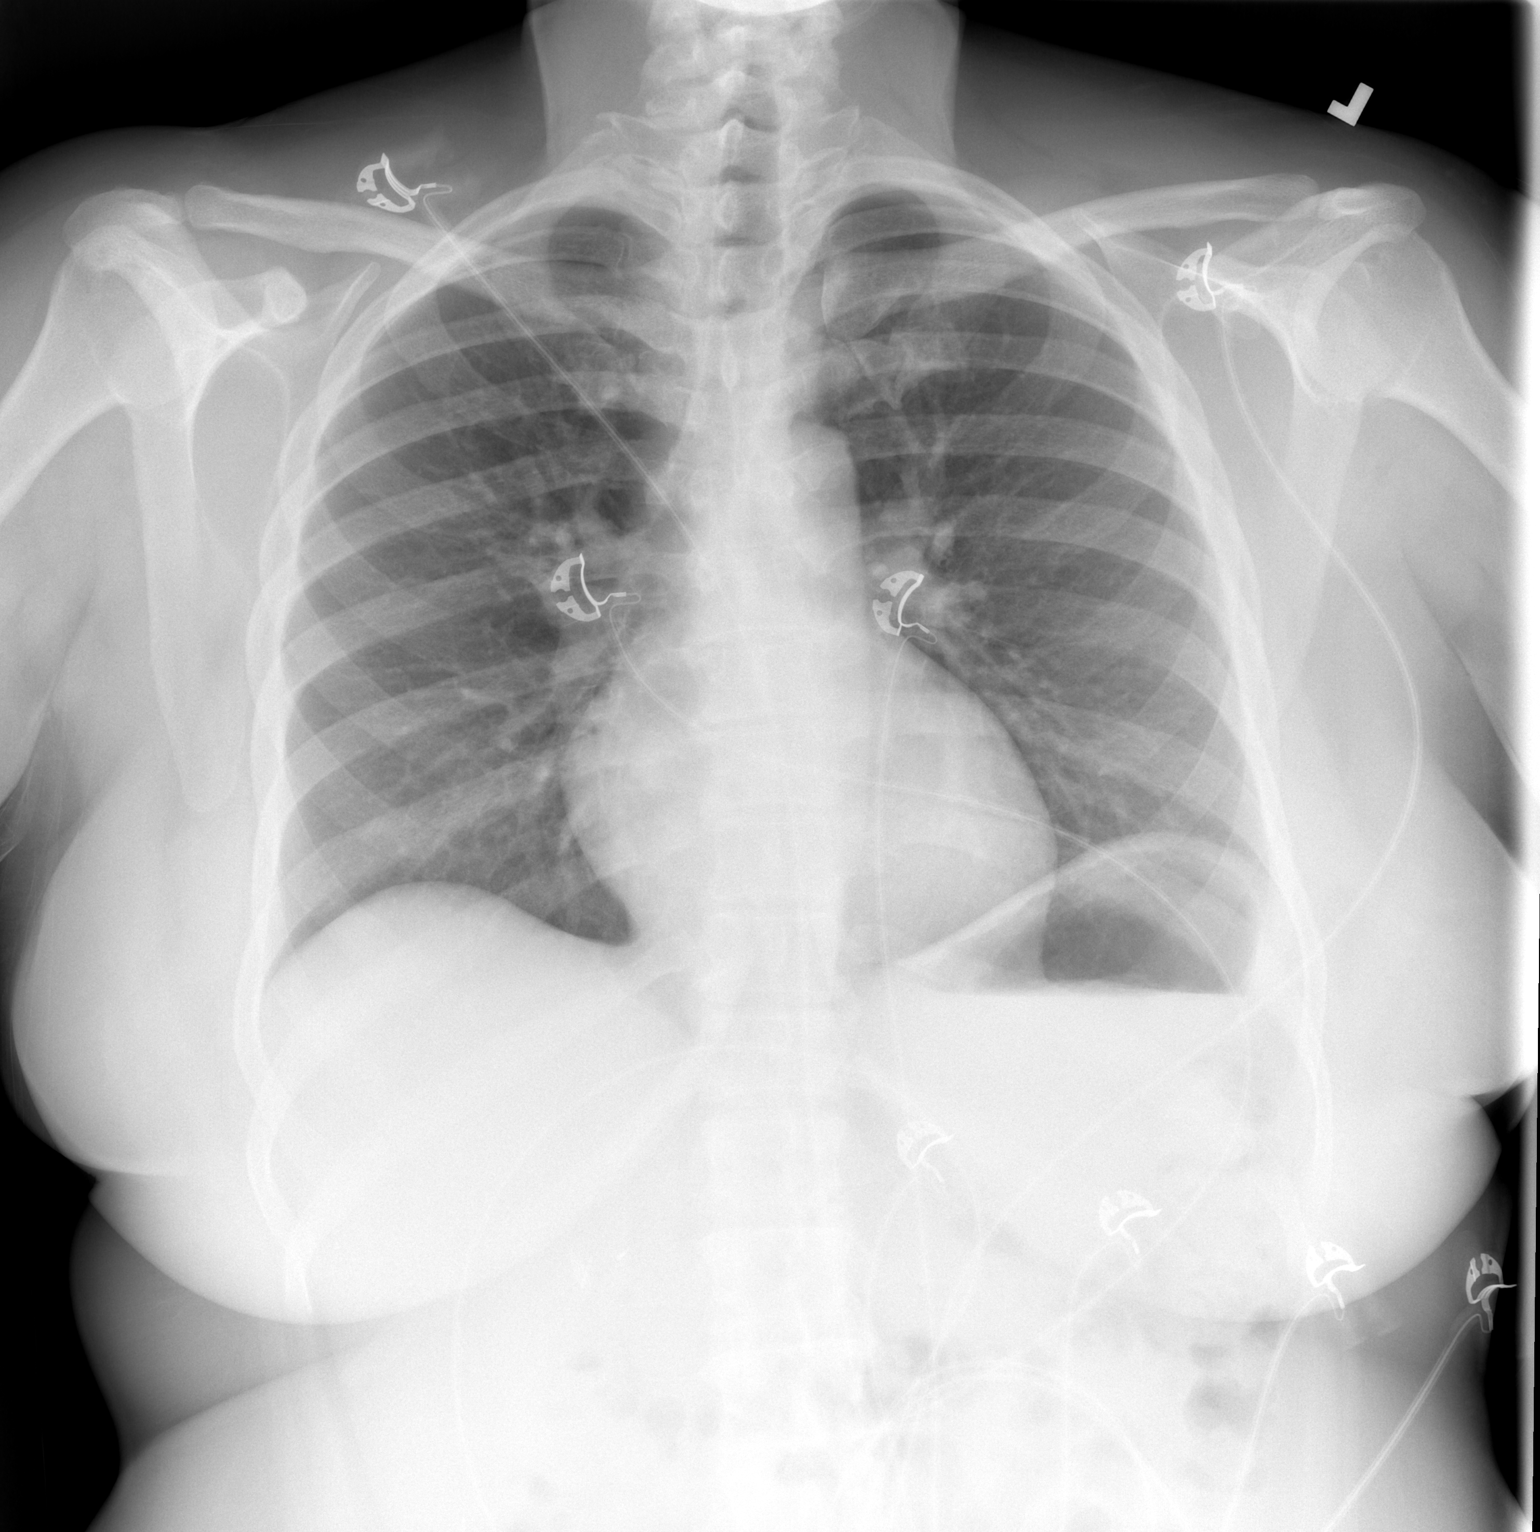

[w chest lat]
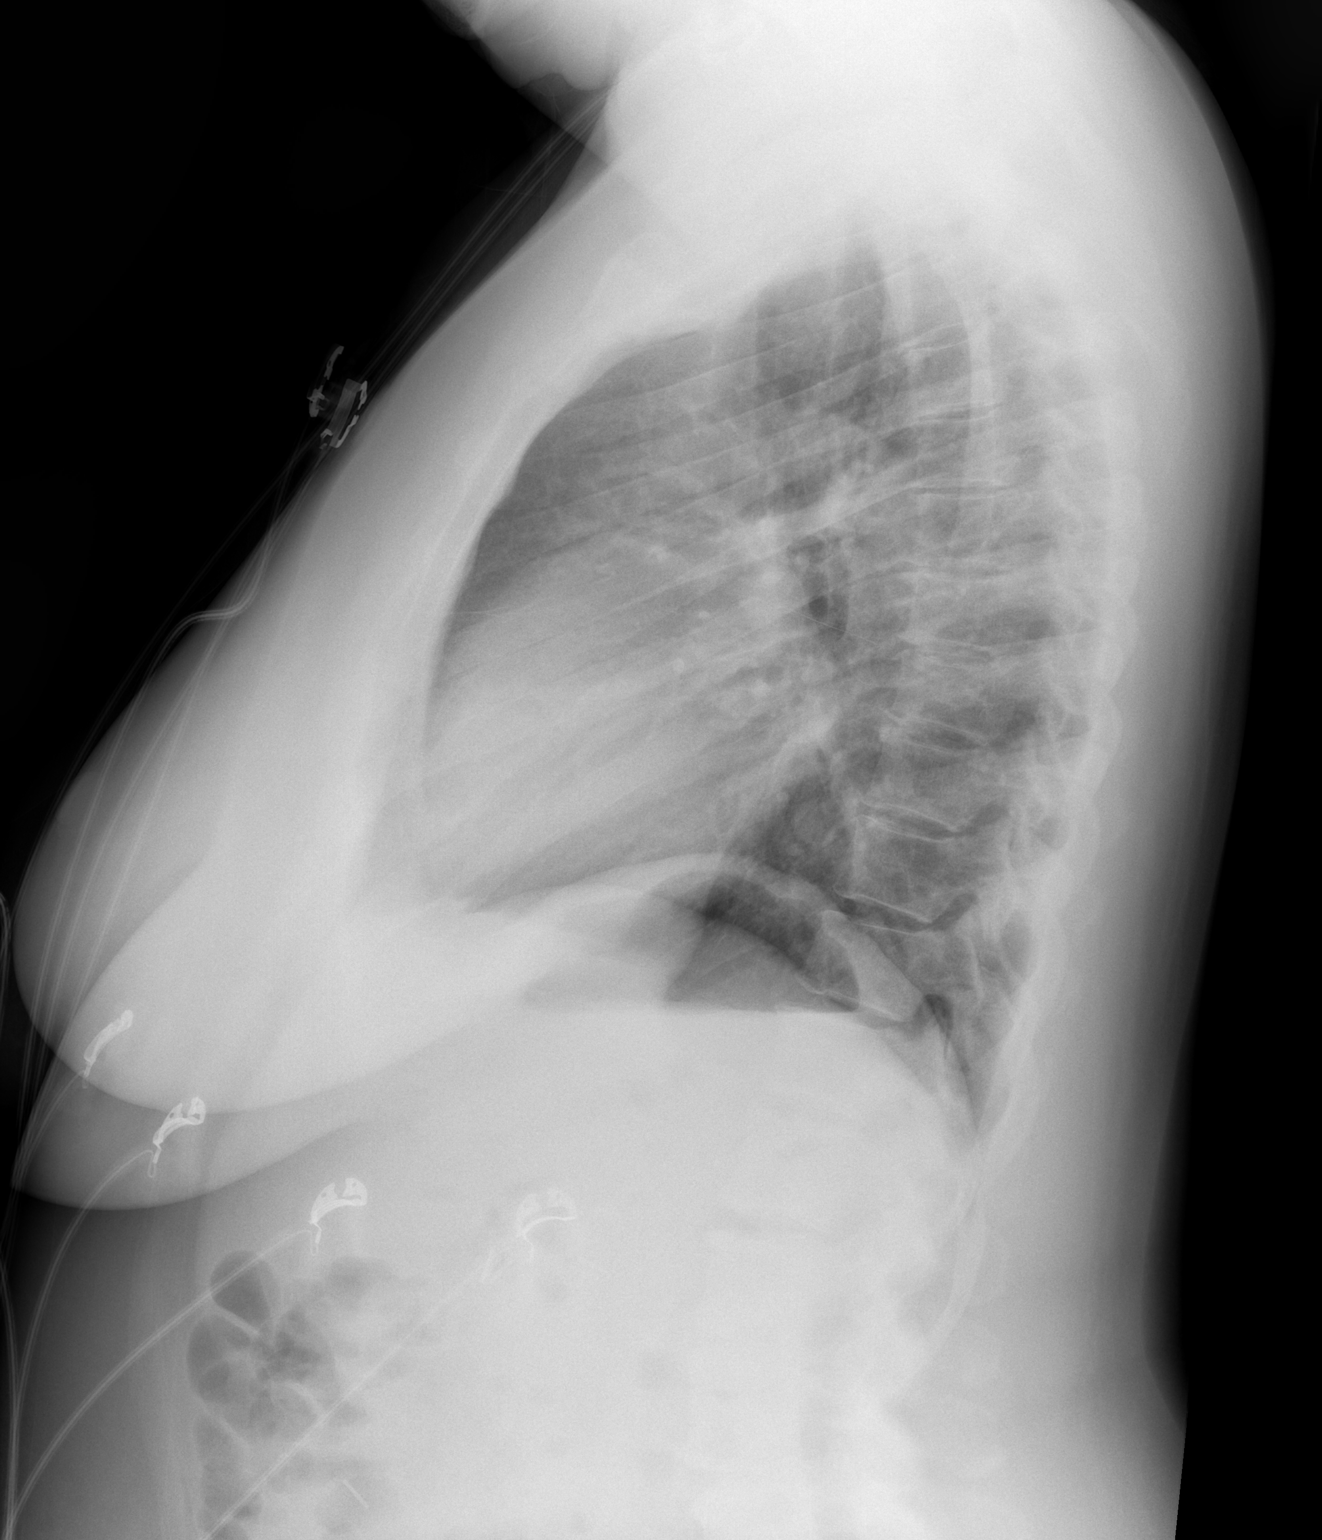

[2 of 2 positions shown; findings below may reference images not displayed]

FINDINGS: The heart size and mediastinal contours are within normal limits.
Both lungs are clear. The visualized skeletal structures are
unremarkable.
IMPRESSION: No active cardiopulmonary disease.
